# Patient Record
Sex: Female | Born: 1990 | Race: Black or African American | Hispanic: Yes | Marital: Single | Smoking: Former smoker
Health system: Southern US, Community
[De-identification: ages and names within clinical notes are randomized; demographics above are authoritative.]

## PROBLEM LIST (undated history)

## (undated) DIAGNOSIS — T7422XA Child sexual abuse, confirmed, initial encounter: Secondary | ICD-10-CM

## (undated) DIAGNOSIS — J45909 Unspecified asthma, uncomplicated: Secondary | ICD-10-CM

## (undated) DIAGNOSIS — Z8619 Personal history of other infectious and parasitic diseases: Secondary | ICD-10-CM

## (undated) DIAGNOSIS — E0590 Thyrotoxicosis, unspecified without thyrotoxic crisis or storm: Secondary | ICD-10-CM

## (undated) HISTORY — PX: DILATION AND CURETTAGE OF UTERUS: SHX78

---

## 2013-11-18 ENCOUNTER — Emergency Department: Payer: Self-pay | Admitting: Emergency Medicine

## 2013-11-18 LAB — CBC WITH DIFFERENTIAL/PLATELET
Basophil #: 0 10*3/uL (ref 0.0–0.1)
Basophil %: 0.3 %
EOS PCT: 2.2 %
Eosinophil #: 0.2 10*3/uL (ref 0.0–0.7)
HCT: 40.1 % (ref 35.0–47.0)
HGB: 13.2 g/dL (ref 12.0–16.0)
LYMPHS PCT: 16.1 %
Lymphocyte #: 1.5 10*3/uL (ref 1.0–3.6)
MCH: 29.2 pg (ref 26.0–34.0)
MCHC: 33 g/dL (ref 32.0–36.0)
MCV: 89 fL (ref 80–100)
MONOS PCT: 4.5 %
Monocyte #: 0.4 x10 3/mm (ref 0.2–0.9)
NEUTROS ABS: 6.9 10*3/uL — AB (ref 1.4–6.5)
NEUTROS PCT: 76.9 %
PLATELETS: 234 10*3/uL (ref 150–440)
RBC: 4.52 10*6/uL (ref 3.80–5.20)
RDW: 12.9 % (ref 11.5–14.5)
WBC: 9 10*3/uL (ref 3.6–11.0)

## 2013-11-18 LAB — BASIC METABOLIC PANEL
Anion Gap: 4 — ABNORMAL LOW (ref 7–16)
BUN: 9 mg/dL (ref 7–18)
Calcium, Total: 9 mg/dL (ref 8.5–10.1)
Chloride: 109 mmol/L — ABNORMAL HIGH (ref 98–107)
Co2: 26 mmol/L (ref 21–32)
Creatinine: 0.78 mg/dL (ref 0.60–1.30)
GLUCOSE: 99 mg/dL (ref 65–99)
Osmolality: 276 (ref 275–301)
Potassium: 3.6 mmol/L (ref 3.5–5.1)
Sodium: 139 mmol/L (ref 136–145)

## 2013-11-23 LAB — CULTURE, BLOOD (SINGLE)

## 2014-08-28 DIAGNOSIS — Z8619 Personal history of other infectious and parasitic diseases: Secondary | ICD-10-CM

## 2014-08-28 HISTORY — DX: Personal history of other infectious and parasitic diseases: Z86.19

## 2016-03-24 ENCOUNTER — Encounter (HOSPITAL_COMMUNITY): Payer: Self-pay | Admitting: *Deleted

## 2016-03-24 ENCOUNTER — Inpatient Hospital Stay (HOSPITAL_COMMUNITY)
Admission: AD | Admit: 2016-03-24 | Discharge: 2016-03-24 | Disposition: A | Discharge status: Home / Self Care | Payer: Medicaid Other | Source: Ambulatory Visit | Attending: Obstetrics & Gynecology | Admitting: Obstetrics & Gynecology

## 2016-03-24 DIAGNOSIS — N760 Acute vaginitis: Secondary | ICD-10-CM

## 2016-03-24 DIAGNOSIS — R109 Unspecified abdominal pain: Secondary | ICD-10-CM | POA: Diagnosis present

## 2016-03-24 DIAGNOSIS — Z3202 Encounter for pregnancy test, result negative: Secondary | ICD-10-CM

## 2016-03-24 DIAGNOSIS — Z87891 Personal history of nicotine dependence: Secondary | ICD-10-CM | POA: Diagnosis not present

## 2016-03-24 DIAGNOSIS — A499 Bacterial infection, unspecified: Secondary | ICD-10-CM | POA: Diagnosis not present

## 2016-03-24 DIAGNOSIS — E213 Hyperparathyroidism, unspecified: Secondary | ICD-10-CM | POA: Diagnosis not present

## 2016-03-24 DIAGNOSIS — B9689 Other specified bacterial agents as the cause of diseases classified elsewhere: Secondary | ICD-10-CM

## 2016-03-24 HISTORY — DX: Thyrotoxicosis, unspecified without thyrotoxic crisis or storm: E05.90

## 2016-03-24 HISTORY — DX: Personal history of other infectious and parasitic diseases: Z86.19

## 2016-03-24 HISTORY — DX: Child sexual abuse, confirmed, initial encounter: T74.22XA

## 2016-03-24 HISTORY — DX: Unspecified asthma, uncomplicated: J45.909

## 2016-03-24 LAB — URINALYSIS, ROUTINE W REFLEX MICROSCOPIC
Bilirubin Urine: NEGATIVE
GLUCOSE, UA: NEGATIVE mg/dL
Hgb urine dipstick: NEGATIVE
Ketones, ur: NEGATIVE mg/dL
Leukocytes, UA: NEGATIVE
Nitrite: NEGATIVE
Protein, ur: NEGATIVE mg/dL
Specific Gravity, Urine: 1.02 (ref 1.005–1.030)
pH: 6 (ref 5.0–8.0)

## 2016-03-24 LAB — WET PREP, GENITAL
SPERM: NONE SEEN
TRICH WET PREP: NONE SEEN
Yeast Wet Prep HPF POC: NONE SEEN

## 2016-03-24 LAB — TYPE AND SCREEN
ABO/RH(D): O POS
Antibody Screen: NEGATIVE

## 2016-03-24 LAB — CBC
HEMATOCRIT: 39.7 % (ref 36.0–46.0)
Hemoglobin: 13.4 g/dL (ref 12.0–15.0)
MCH: 28.9 pg (ref 26.0–34.0)
MCHC: 33.8 g/dL (ref 30.0–36.0)
MCV: 85.6 fL (ref 78.0–100.0)
PLATELETS: 269 10*3/uL (ref 150–400)
RBC: 4.64 MIL/uL (ref 3.87–5.11)
RDW: 13.1 % (ref 11.5–15.5)
WBC: 5.9 10*3/uL (ref 4.0–10.5)

## 2016-03-24 LAB — HCG, QUANTITATIVE, PREGNANCY: hCG, Beta Chain, Quant, S: <1 m[IU]/mL (ref <5)

## 2016-03-24 LAB — POCT PREGNANCY, URINE: PREG TEST UR: NEGATIVE

## 2016-03-24 LAB — ABO/RH: ABO/RH(D): O POS

## 2016-03-24 MED ORDER — METRONIDAZOLE 500 MG PO TABS: 500.0000 mg | ORAL_TABLET | Freq: Two times a day (BID) | ORAL | 0 refills | Status: DC

## 2016-03-24 NOTE — MAU Note (Signed)
Experiencing pain in lower abd- started on the 23rd, went to hosp on the 24th. Couldn't tell them definitively if she had been pregnant or if she had a miscarriage.Marland Kitchen Has been nauseated.  (had blood work, [place], CT scan), has been really constipated

## 2016-03-24 NOTE — Discharge Instructions (Signed)
Bacterial Vaginosis Bacterial vaginosis is a vaginal infection that occurs when the normal balance of bacteria in the vagina is disrupted. It results from an overgrowth of certain bacteria. This is the most common vaginal infection in women of childbearing age. Treatment is important to prevent complications, especially in pregnant women, as it can cause a premature delivery. CAUSES  Bacterial vaginosis is caused by an increase in harmful bacteria that are normally present in smaller amounts in the vagina. Several different kinds of bacteria can cause bacterial vaginosis. However, the reason that the condition develops is not fully understood. RISK FACTORS Certain activities or behaviors can put you at an increased risk of developing bacterial vaginosis, including:  Having a new sex partner or multiple sex partners.  Douching.  Using an intrauterine device (IUD) for contraception. Women do not get bacterial vaginosis from toilet seats, bedding, swimming pools, or contact with objects around them. SIGNS AND SYMPTOMS  Some women with bacterial vaginosis have no signs or symptoms. Common symptoms include:  Grey vaginal discharge.  A fishlike odor with discharge, especially after sexual intercourse.  Itching or burning of the vagina and vulva.  Burning or pain with urination. DIAGNOSIS  Your health care provider will take a medical history and examine the vagina for signs of bacterial vaginosis. A sample of vaginal fluid may be taken. Your health care provider will look at this sample under a microscope to check for bacteria and abnormal cells. A vaginal pH test may also be done.  TREATMENT  Bacterial vaginosis may be treated with antibiotic medicines. These may be given in the form of a pill or a vaginal cream. A second round of antibiotics may be prescribed if the condition comes back after treatment. Because bacterial vaginosis increases your risk for sexually transmitted diseases, getting  treated can help reduce your risk for chlamydia, gonorrhea, HIV, and herpes. HOME CARE INSTRUCTIONS   Only take over-the-counter or prescription medicines as directed by your health care provider. If antibiotic medicine was prescribed, take it as directed. Make sure you finish it even if you start to feel better. Abdominal Pain, Adult Many things can cause belly (abdominal) pain. Most times, the belly pain is not dangerous. Many cases of belly pain can be watched and treated at home. HOME CARE  Do not take medicines that help you go poop (laxatives) unless told to by your doctor. Only take medicine as told by your doctor. Eat or drink as told by your doctor. Your doctor will tell you if you should be on a special diet. GET HELP IF: You do not know what is causing your belly pain. You have belly pain while you are sick to your stomach (nauseous) or have runny poop (diarrhea). You have pain while you pee or poop. Your belly pain wakes you up at night. You have belly pain that gets worse or better when you eat. You have belly pain that gets worse when you eat fatty foods. You have a fever. GET HELP RIGHT AWAY IF:  The pain does not go away within 2 hours. You keep throwing up (vomiting). The pain changes and is only in the right or left part of the belly. You have bloody or tarry looking poop. MAKE SURE YOU:  Understand these instructions. Will watch your condition. Will get help right away if you are not doing well or get worse.   This information is not intended to replace advice given to you by your health care provider. Make sure you  discuss any questions you have with your health care provider.   Document Released: 01/31/2008 Document Revised: 09/04/2014 Document Reviewed: 04/23/2013 Elsevier Interactive Patient Education [employer]    During treatment, it is important that you follow these instructions:  Avoid sexual activity or use condoms correctly.  Do not  douche.  Avoid alcohol as directed by your health care provider.  Avoid breastfeeding as directed by your health care provider. SEEK MEDICAL CARE IF:   Your symptoms are not improving after 3 days of treatment.  You have increased discharge or pain.  You have a fever. MAKE SURE YOU:   Understand these instructions.  Will watch your condition.  Will get help right away if you are not doing well or get worse. FOR MORE INFORMATION  Centers for Disease Control and Prevention, Division of STD Prevention: [employer]Apps.co.za American Sexual Health Association (ASHA): www.ashastd.org    This information is not intended to replace advice given to you by your health care provider. Make sure you discuss any questions you have with your health care provider.   Document Released: 08/14/2005 Document Revised: 09/04/2014 Document Reviewed: 03/26/2013 Elsevier Interactive Patient Education [employer]

## 2016-03-24 NOTE — MAU Note (Signed)
4 HPT: first rest was neg early May; +HPT end of MAY and 2 more + in June
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
## 2016-03-25 LAB — HIV ANTIBODY (ROUTINE TESTING W REFLEX): HIV Screen 4th Generation wRfx: NONREACTIVE

## 2016-03-27 LAB — GC/CHLAMYDIA PROBE AMP (~~LOC~~) NOT AT ARMC
CHLAMYDIA, DNA PROBE: NEGATIVE
NEISSERIA GONORRHEA: NEGATIVE

## 2017-02-12 ENCOUNTER — Emergency Department (HOSPITAL_COMMUNITY): Payer: Medicaid Other

## 2017-02-12 ENCOUNTER — Encounter (HOSPITAL_COMMUNITY): Payer: Self-pay

## 2017-02-12 ENCOUNTER — Emergency Department (HOSPITAL_COMMUNITY)
Admission: EM | Admit: 2017-02-12 | Discharge: 2017-02-13 | Disposition: A | Discharge status: Home / Self Care | Payer: Medicaid Other | Attending: Emergency Medicine | Admitting: Emergency Medicine

## 2017-02-12 DIAGNOSIS — Y33XXXA Other specified events, undetermined intent, initial encounter: Secondary | ICD-10-CM | POA: Diagnosis not present

## 2017-02-12 DIAGNOSIS — E0590 Thyrotoxicosis, unspecified without thyrotoxic crisis or storm: Secondary | ICD-10-CM | POA: Insufficient documentation

## 2017-02-12 DIAGNOSIS — Z79899 Other long term (current) drug therapy: Secondary | ICD-10-CM | POA: Diagnosis not present

## 2017-02-12 DIAGNOSIS — Z87891 Personal history of nicotine dependence: Secondary | ICD-10-CM | POA: Insufficient documentation

## 2017-02-12 DIAGNOSIS — Y9389 Activity, other specified: Secondary | ICD-10-CM | POA: Insufficient documentation

## 2017-02-12 DIAGNOSIS — Y929 Unspecified place or not applicable: Secondary | ICD-10-CM | POA: Diagnosis not present

## 2017-02-12 DIAGNOSIS — Y990 Civilian activity done for income or pay: Secondary | ICD-10-CM | POA: Insufficient documentation

## 2017-02-12 DIAGNOSIS — S63502A Unspecified sprain of left wrist, initial encounter: Secondary | ICD-10-CM

## 2017-02-12 DIAGNOSIS — J45909 Unspecified asthma, uncomplicated: Secondary | ICD-10-CM | POA: Insufficient documentation

## 2017-02-12 DIAGNOSIS — S6992XA Unspecified injury of left wrist, hand and finger(s), initial encounter: Secondary | ICD-10-CM | POA: Diagnosis present

## 2017-02-12 MED ORDER — IBUPROFEN 800 MG PO TABS
800.0000 mg | ORAL_TABLET | Freq: Once | ORAL | Status: AC
  Administered 2017-02-12: 800 mg via ORAL
  Filled 2017-02-12: qty 1

## 2017-02-12 MED ORDER — ONDANSETRON 4 MG PO TBDP
ORAL_TABLET | ORAL | Status: AC
  Filled 2017-02-12: qty 1

## 2017-02-12 MED ORDER — IBUPROFEN 800 MG PO TABS: 800.0000 mg | ORAL_TABLET | Freq: Three times a day (TID) | ORAL | 0 refills | Status: DC

## 2017-02-12 NOTE — ED Notes (Signed)
Patient Alert and oriented X4. Stable and ambulatory. Patient verbalized understanding of the discharge instructions.  Patient belongings were taken by the patient.  

## 2017-02-12 NOTE — ED Notes (Signed)
Patient called out and stated they would like to see the provider.  This RN talked with patient and explained that the provider is in another room and they will be with them when they are finished.

## 2017-02-12 NOTE — Discharge Instructions (Signed)
As discussed, follow the RICE protocol provided in these instructions. Ibuprofen for pain and swelling. Follow up with orthopedics.  Return if pain worsens, swelling, loss of sensation or discoloration or any new concerning symptoms in the meantime.

## 2017-02-12 NOTE — ED Notes (Signed)
ED PA at bedside

## 2017-02-12 NOTE — ED Triage Notes (Signed)
Pt endorses left wrist pain that began when moving a patient 3 days ago. Pt able to make fist but has limited movement of left wrist. VSS.
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
## 2017-05-09 ENCOUNTER — Emergency Department (HOSPITAL_COMMUNITY)
Admission: EM | Admit: 2017-05-09 | Discharge: 2017-05-09 | Disposition: A | Discharge status: Home / Self Care | Payer: Medicaid Other | Attending: Emergency Medicine | Admitting: Emergency Medicine

## 2017-05-09 ENCOUNTER — Encounter (HOSPITAL_COMMUNITY): Payer: Self-pay

## 2017-05-09 DIAGNOSIS — B309 Viral conjunctivitis, unspecified: Secondary | ICD-10-CM

## 2017-05-09 DIAGNOSIS — E039 Hypothyroidism, unspecified: Secondary | ICD-10-CM | POA: Insufficient documentation

## 2017-05-09 DIAGNOSIS — J069 Acute upper respiratory infection, unspecified: Secondary | ICD-10-CM | POA: Diagnosis not present

## 2017-05-09 DIAGNOSIS — J45909 Unspecified asthma, uncomplicated: Secondary | ICD-10-CM | POA: Insufficient documentation

## 2017-05-09 DIAGNOSIS — R600 Localized edema: Secondary | ICD-10-CM | POA: Diagnosis present

## 2017-05-09 DIAGNOSIS — Z79899 Other long term (current) drug therapy: Secondary | ICD-10-CM | POA: Insufficient documentation

## 2017-05-09 DIAGNOSIS — Z87891 Personal history of nicotine dependence: Secondary | ICD-10-CM | POA: Diagnosis not present

## 2017-05-09 MED ORDER — ALBUTEROL SULFATE HFA 108 (90 BASE) MCG/ACT IN AERS
2.0000 | INHALATION_SPRAY | Freq: Once | RESPIRATORY_TRACT | Status: AC
  Administered 2017-05-09: 2 via RESPIRATORY_TRACT
  Filled 2017-05-09: qty 6.7

## 2017-05-09 MED ORDER — CIPROFLOXACIN HCL 0.3 % OP SOLN
1.0000 [drp] | Freq: Once | OPHTHALMIC | Status: AC
  Administered 2017-05-09: 1 [drp] via OPHTHALMIC
  Filled 2017-05-09: qty 2.5

## 2017-05-09 MED ORDER — METHYLPREDNISOLONE 4 MG PO TBPK: ORAL_TABLET | ORAL | 0 refills | Status: DC

## 2017-05-09 MED ORDER — LEVOCETIRIZINE DIHYDROCHLORIDE 5 MG PO TABS: 5.0000 mg | ORAL_TABLET | Freq: Every evening | ORAL | 1 refills | Status: DC

## 2017-05-09 MED ORDER — PSEUDOEPHEDRINE HCL ER 120 MG PO TB12: 120.0000 mg | ORAL_TABLET | Freq: Two times a day (BID) | ORAL | 0 refills | Status: DC

## 2017-05-09 MED ORDER — KETOTIFEN FUMARATE 0.025 % OP SOLN: 1.0000 [drp] | Freq: Two times a day (BID) | OPHTHALMIC | 0 refills | Status: DC

## 2017-05-09 NOTE — ED Triage Notes (Signed)
Per Pt, Pt reports itchy throat and watery eyes that started two days ago. Pt reports discharge from her eyes in the morning with some pressure. Reports some throat discomfort.

## 2017-05-09 NOTE — Discharge Instructions (Signed)
You appear to have an upper respiratory infection (URI). An upper respiratory tract infection, or cold, is a viral infection of the air passages leading to the lungs. It is contagious and can be spread to others, especially during the first 3 or 4 days. It cannot be cured by antibiotics or other medicines. RETURN IMMEDIATELY IF you develop shortness of breath, confusion or altered mental status, a new rash, become dizzy, faint, or poorly responsive, or are unable to be cared for at home.  Contact a health care provider if: Your symptoms do not improve with treatment or they get worse. You have increased pain. Your vision becomes blurry. You have a fever. You have facial pain, redness, or swelling. You have yellow or green drainage coming from your eye. You have new symptoms.
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
## 2017-11-16 ENCOUNTER — Encounter (HOSPITAL_COMMUNITY): Payer: Self-pay

## 2017-11-16 ENCOUNTER — Emergency Department (HOSPITAL_COMMUNITY)
Admission: EM | Admit: 2017-11-16 | Discharge: 2017-11-17 | Disposition: A | Discharge status: Home / Self Care | Payer: Medicaid Other | Attending: Emergency Medicine | Admitting: Emergency Medicine

## 2017-11-16 DIAGNOSIS — Z87891 Personal history of nicotine dependence: Secondary | ICD-10-CM | POA: Insufficient documentation

## 2017-11-16 DIAGNOSIS — E039 Hypothyroidism, unspecified: Secondary | ICD-10-CM | POA: Diagnosis not present

## 2017-11-16 DIAGNOSIS — K529 Noninfective gastroenteritis and colitis, unspecified: Secondary | ICD-10-CM | POA: Diagnosis not present

## 2017-11-16 DIAGNOSIS — Z79899 Other long term (current) drug therapy: Secondary | ICD-10-CM | POA: Diagnosis not present

## 2017-11-16 DIAGNOSIS — R112 Nausea with vomiting, unspecified: Secondary | ICD-10-CM | POA: Diagnosis present

## 2017-11-16 DIAGNOSIS — J45909 Unspecified asthma, uncomplicated: Secondary | ICD-10-CM | POA: Diagnosis not present

## 2017-11-16 LAB — CBC
HCT: 44.9 % (ref 36.0–46.0)
Hemoglobin: 14.9 g/dL (ref 12.0–15.0)
MCH: 29.9 pg (ref 26.0–34.0)
MCHC: 33.2 g/dL (ref 30.0–36.0)
MCV: 90.2 fL (ref 78.0–100.0)
PLATELETS: 299 10*3/uL (ref 150–400)
RBC: 4.98 MIL/uL (ref 3.87–5.11)
RDW: 13.6 % (ref 11.5–15.5)
WBC: 7.8 10*3/uL (ref 4.0–10.5)

## 2017-11-16 LAB — I-STAT BETA HCG BLOOD, ED (MC, WL, AP ONLY)

## 2017-11-16 LAB — COMPREHENSIVE METABOLIC PANEL
ALT: 19 U/L (ref 14–54)
ANION GAP: 8 (ref 5–15)
AST: 24 U/L (ref 15–41)
Albumin: 4 g/dL (ref 3.5–5.0)
Alkaline Phosphatase: 52 U/L (ref 38–126)
BUN: 7 mg/dL (ref 6–20)
CO2: 24 mmol/L (ref 22–32)
Calcium: 9.1 mg/dL (ref 8.9–10.3)
Chloride: 104 mmol/L (ref 101–111)
Creatinine, Ser: 0.71 mg/dL (ref 0.44–1.00)
GFR calc Af Amer: >60 mL/min (ref >60)
Glucose, Bld: 134 mg/dL — ABNORMAL HIGH (ref 65–99)
POTASSIUM: 4 mmol/L (ref 3.5–5.1)
Sodium: 136 mmol/L (ref 135–145)
TOTAL PROTEIN: 7.1 g/dL (ref 6.5–8.1)
Total Bilirubin: 0.4 mg/dL (ref 0.3–1.2)

## 2017-11-16 LAB — LIPASE, BLOOD: LIPASE: 26 U/L (ref 11–51)

## 2017-11-16 MED ORDER — PROMETHAZINE HCL 25 MG/ML IJ SOLN
25.0000 mg | Freq: Once | INTRAMUSCULAR | Status: AC
  Administered 2017-11-16: 25 mg via INTRAVENOUS
  Filled 2017-11-16: qty 1

## 2017-11-16 MED ORDER — ONDANSETRON 4 MG PO TBDP
4.0000 mg | ORAL_TABLET | Freq: Once | ORAL | Status: AC | PRN
  Administered 2017-11-16: 4 mg via ORAL
  Filled 2017-11-16: qty 1

## 2017-11-16 MED ORDER — ONDANSETRON 4 MG PO TBDP
4.0000 mg | ORAL_TABLET | Freq: Once | ORAL | Status: AC
  Administered 2017-11-16: 4 mg via ORAL
  Filled 2017-11-16: qty 1

## 2017-11-16 MED ORDER — SODIUM CHLORIDE 0.9 % IV BOLUS (SEPSIS)
2000.0000 mL | Freq: Once | INTRAVENOUS | Status: AC
Start: 2017-11-16 — End: 2017-11-16
  Administered 2017-11-16: 2000 mL via INTRAVENOUS

## 2017-11-16 NOTE — ED Notes (Signed)
Pt drinking fluids at this time

## 2017-11-16 NOTE — ED Triage Notes (Signed)
Pt presents for evaluation of abd pain with N/V/D starting this AM. Pt reports headache and dizziness since started vomiting. States emesis approximately 20 times, unable to tolerate anything PO.
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
## 2017-11-17 MED ORDER — PROMETHAZINE HCL 25 MG PO TABS: 25.0000 mg | ORAL_TABLET | Freq: Four times a day (QID) | ORAL | 0 refills | Status: DC | PRN

## 2017-11-17 NOTE — Discharge Instructions (Addendum)
Slowly increase your fluid intake.  Rest as much as possible.  Following the brat diet provided.

## 2019-11-21 ENCOUNTER — Other Ambulatory Visit: Payer: Self-pay | Admitting: Urgent Care

## 2019-11-24 ENCOUNTER — Other Ambulatory Visit: Payer: Self-pay | Admitting: Urgent Care

## 2019-11-24 DIAGNOSIS — N643 Galactorrhea not associated with childbirth: Secondary | ICD-10-CM

## 2020-06-30 ENCOUNTER — Other Ambulatory Visit: Payer: Self-pay

## 2020-06-30 DIAGNOSIS — Z20822 Contact with and (suspected) exposure to covid-19: Secondary | ICD-10-CM

## 2020-07-02 LAB — SARS-COV-2, NAA 2 DAY TAT

## 2020-07-02 LAB — NOVEL CORONAVIRUS, NAA: SARS-CoV-2, NAA: NOT DETECTED

## 2020-08-18 ENCOUNTER — Other Ambulatory Visit: Payer: Self-pay | Admitting: Internal Medicine

## 2020-08-18 ENCOUNTER — Other Ambulatory Visit: Payer: Self-pay | Admitting: Family Medicine

## 2020-08-18 ENCOUNTER — Other Ambulatory Visit (HOSPITAL_COMMUNITY): Payer: Self-pay | Admitting: Family Medicine

## 2020-08-18 DIAGNOSIS — O209 Hemorrhage in early pregnancy, unspecified: Secondary | ICD-10-CM

## 2020-08-19 ENCOUNTER — Ambulatory Visit
Admission: RE | Admit: 2020-08-19 | Discharge: 2020-08-19 | Disposition: A | Discharge status: Home / Self Care | Payer: Medicaid Other | Source: Ambulatory Visit | Attending: Family Medicine | Admitting: Family Medicine

## 2020-08-19 ENCOUNTER — Other Ambulatory Visit: Payer: Self-pay

## 2020-08-19 DIAGNOSIS — O209 Hemorrhage in early pregnancy, unspecified: Secondary | ICD-10-CM | POA: Insufficient documentation

## 2020-09-07 LAB — OB RESULTS CONSOLE VARICELLA ZOSTER ANTIBODY, IGG: Varicella: IMMUNE

## 2020-09-07 LAB — OB RESULTS CONSOLE HEPATITIS B SURFACE ANTIGEN: Hepatitis B Surface Ag: NEGATIVE

## 2020-09-07 LAB — OB RESULTS CONSOLE RPR: RPR: NONREACTIVE

## 2020-09-07 LAB — OB RESULTS CONSOLE RUBELLA ANTIBODY, IGM: Rubella: IMMUNE

## 2020-09-07 LAB — OB RESULTS CONSOLE HIV ANTIBODY (ROUTINE TESTING): HIV: NONREACTIVE

## 2020-09-08 ENCOUNTER — Other Ambulatory Visit: Payer: Self-pay

## 2020-09-08 DIAGNOSIS — Z20822 Contact with and (suspected) exposure to covid-19: Secondary | ICD-10-CM

## 2020-09-08 LAB — OB RESULTS CONSOLE GC/CHLAMYDIA
Chlamydia: NEGATIVE
Gonorrhea: NEGATIVE

## 2020-09-10 LAB — NOVEL CORONAVIRUS, NAA: SARS-CoV-2, NAA: NOT DETECTED

## 2020-09-10 LAB — SARS-COV-2, NAA 2 DAY TAT

## 2020-09-14 ENCOUNTER — Other Ambulatory Visit: Payer: Self-pay

## 2020-09-15 ENCOUNTER — Other Ambulatory Visit: Payer: Self-pay

## 2020-11-09 ENCOUNTER — Other Ambulatory Visit: Payer: Self-pay | Admitting: Cardiology

## 2020-11-09 ENCOUNTER — Other Ambulatory Visit: Payer: Self-pay | Admitting: Family Medicine

## 2020-11-09 DIAGNOSIS — R0789 Other chest pain: Secondary | ICD-10-CM

## 2020-11-09 DIAGNOSIS — R55 Syncope and collapse: Secondary | ICD-10-CM

## 2020-11-09 DIAGNOSIS — Z3689 Encounter for other specified antenatal screening: Secondary | ICD-10-CM

## 2020-11-16 ENCOUNTER — Other Ambulatory Visit: Payer: Self-pay

## 2020-11-16 ENCOUNTER — Observation Stay
Admission: EM | Admit: 2020-11-16 | Discharge: 2020-11-17 | Disposition: A | Discharge status: Home / Self Care | Payer: BLUE CROSS/BLUE SHIELD | Attending: Obstetrics and Gynecology | Admitting: Obstetrics and Gynecology

## 2020-11-16 ENCOUNTER — Observation Stay: Payer: BLUE CROSS/BLUE SHIELD

## 2020-11-16 DIAGNOSIS — O99512 Diseases of the respiratory system complicating pregnancy, second trimester: Secondary | ICD-10-CM | POA: Insufficient documentation

## 2020-11-16 DIAGNOSIS — Z3A21 21 weeks gestation of pregnancy: Secondary | ICD-10-CM | POA: Insufficient documentation

## 2020-11-16 DIAGNOSIS — Z3490 Encounter for supervision of normal pregnancy, unspecified, unspecified trimester: Secondary | ICD-10-CM

## 2020-11-16 DIAGNOSIS — Z20822 Contact with and (suspected) exposure to covid-19: Secondary | ICD-10-CM | POA: Diagnosis not present

## 2020-11-16 DIAGNOSIS — J45909 Unspecified asthma, uncomplicated: Secondary | ICD-10-CM | POA: Diagnosis not present

## 2020-11-16 DIAGNOSIS — R102 Pelvic and perineal pain: Secondary | ICD-10-CM | POA: Diagnosis not present

## 2020-11-16 DIAGNOSIS — O26899 Other specified pregnancy related conditions, unspecified trimester: Secondary | ICD-10-CM

## 2020-11-16 DIAGNOSIS — Z87891 Personal history of nicotine dependence: Secondary | ICD-10-CM | POA: Diagnosis not present

## 2020-11-16 DIAGNOSIS — O26892 Other specified pregnancy related conditions, second trimester: Principal | ICD-10-CM | POA: Insufficient documentation

## 2020-11-16 LAB — URINALYSIS, ROUTINE W REFLEX MICROSCOPIC
Bilirubin Urine: NEGATIVE
Glucose, UA: 150 mg/dL — AB
Hgb urine dipstick: NEGATIVE
Ketones, ur: NEGATIVE mg/dL
Leukocytes,Ua: NEGATIVE
Nitrite: NEGATIVE
Protein, ur: NEGATIVE mg/dL
Specific Gravity, Urine: 1.01 (ref 1.005–1.030)
pH: 6 (ref 5.0–8.0)

## 2020-11-16 LAB — CBC
HCT: 34.6 % — ABNORMAL LOW (ref 36.0–46.0)
Hemoglobin: 11.6 g/dL — ABNORMAL LOW (ref 12.0–15.0)
MCH: 29.2 pg (ref 26.0–34.0)
MCHC: 33.5 g/dL (ref 30.0–36.0)
MCV: 87.2 fL (ref 80.0–100.0)
Platelets: 237 10*3/uL (ref 150–400)
RBC: 3.97 MIL/uL (ref 3.87–5.11)
RDW: 13.4 % (ref 11.5–15.5)
WBC: 8.4 10*3/uL (ref 4.0–10.5)
nRBC: 0 % (ref 0.0–0.2)

## 2020-11-16 LAB — TYPE AND SCREEN
ABO/RH(D): O POS
Antibody Screen: NEGATIVE

## 2020-11-16 MED ORDER — OXYCODONE-ACETAMINOPHEN 5-325 MG PO TABS
1.0000 | ORAL_TABLET | ORAL | Status: DC | PRN
  Administered 2020-11-16 – 2020-11-17 (×2): 1 via ORAL
  Filled 2020-11-16 (×2): qty 1

## 2020-11-16 MED ORDER — ONDANSETRON HCL 4 MG/2ML IJ SOLN
4.0000 mg | Freq: Four times a day (QID) | INTRAMUSCULAR | Status: DC | PRN
  Administered 2020-11-17: 4 mg via INTRAVENOUS
  Filled 2020-11-16: qty 2

## 2020-11-16 MED ORDER — LACTATED RINGERS IV SOLN: INTRAVENOUS | Status: DC

## 2020-11-16 MED ORDER — OXYCODONE-ACETAMINOPHEN 5-325 MG PO TABS
ORAL_TABLET | ORAL | Status: AC
  Filled 2020-11-16: qty 1

## 2020-11-16 MED ORDER — LIDOCAINE HCL (PF) 1 % IJ SOLN: 30.0000 mL | INTRAMUSCULAR | Status: DC | PRN

## 2020-11-16 MED ORDER — SOD CITRATE-CITRIC ACID 500-334 MG/5ML PO SOLN: 30.0000 mL | ORAL | Status: DC | PRN

## 2020-11-16 MED ORDER — ACETAMINOPHEN 325 MG PO TABS
650.0000 mg | ORAL_TABLET | ORAL | Status: DC | PRN
  Administered 2020-11-17: 650 mg via ORAL
  Filled 2020-11-16: qty 2
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
## 2020-11-16 NOTE — OB Triage Note (Signed)
Patient G4P1 [redacted]w[redacted]d presents to L&D with complaints of abdominal pain since yesterday at 1500. She reports pain getting worse in intensity, but same frequency. She reports having some spotting last night at 3 AM, but reports not having any since, She reports feeling movement, but being unaware since the pain started. She denies vaginal discharge and recent intercourse. She reports complications of dizziness in her pregnancy where she feels her chest tighten/gets weak/and loses mobility. She reports being followed by cardiologist for this in the pregnancy. Monitors applies and assessing. VSS.

## 2020-11-17 ENCOUNTER — Observation Stay: Payer: BLUE CROSS/BLUE SHIELD

## 2020-11-17 DIAGNOSIS — O26892 Other specified pregnancy related conditions, second trimester: Secondary | ICD-10-CM | POA: Diagnosis not present

## 2020-11-17 LAB — RPR: RPR Ser Ql: NONREACTIVE

## 2020-11-17 LAB — RESP PANEL BY RT-PCR (FLU A&B, COVID) ARPGX2
Influenza A by PCR: NEGATIVE
Influenza B by PCR: NEGATIVE
SARS Coronavirus 2 by RT PCR: NEGATIVE

## 2020-11-17 LAB — ABO/RH: ABO/RH(D): O POS

## 2020-11-17 MED ORDER — PROGESTERONE MICRONIZED 100 MG PO CAPS: 100.0000 mg | ORAL_CAPSULE | Freq: Every day | ORAL | 11 refills | Status: DC

## 2020-11-17 NOTE — Progress Notes (Signed)
Patient back to unit from ultrasound.

## 2020-11-17 NOTE — Discharge Summary (Signed)
Patient ID: Suzanne Wilson MRN: [id number] DOB/AGE: [birth year] 29 y.o.  Admit date: 11/16/2020 Discharge date: 11/17/2020  Admission Diagnoses: Lower pelvic pressure with a h/o early cervical dilation   Discharge Diagnoses: no cervical shortening   Prenatal Procedures: ultrasound  Consults: None   Significant Diagnostic Studies:  Results for orders placed or performed during the hospital encounter of 11/16/20 (from the past 168 hour(s))  CBC   Collection Time: 11/16/20  8:57 PM  Result Value Ref Range   WBC 8.4 4.0 - 10.5 K/uL   RBC 3.97 3.87 - 5.11 MIL/uL   Hemoglobin 11.6 (L) 12.0 - 15.0 g/dL   HCT 50.3 (L) 54.6 - 56.8 %   MCV 87.2 80.0 - 100.0 fL   MCH 29.2 26.0 - 34.0 pg   MCHC 33.5 30.0 - 36.0 g/dL   RDW 12.7 51.7 - 00.1 %   Platelets 237 150 - 400 K/uL   nRBC 0.0 0.0 - 0.2 %  RPR   Collection Time: 11/16/20  8:57 PM  Result Value Ref Range   RPR Ser Ql NON REACTIVE NON REACTIVE  Urinalysis, Routine w reflex microscopic Urine, Clean Catch   Collection Time: 11/16/20  8:57 PM  Result Value Ref Range   Color, Urine YELLOW (A) YELLOW   APPearance HAZY (A) CLEAR   Specific Gravity, Urine 1.010 1.005 - 1.030   pH 6.0 5.0 - 8.0   Glucose, UA 150 (A) NEGATIVE mg/dL   Hgb urine dipstick NEGATIVE NEGATIVE   Bilirubin Urine NEGATIVE NEGATIVE   Ketones, ur NEGATIVE NEGATIVE mg/dL   Protein, ur NEGATIVE NEGATIVE mg/dL   Nitrite NEGATIVE NEGATIVE   Leukocytes,Ua NEGATIVE NEGATIVE  Type and screen [facility]   Collection Time: 11/16/20  8:57 PM  Result Value Ref Range   ABO/RH(D) O POS    Antibody Screen NEG    Sample Expiration      11/19/2020,2359 Performed at [facility], [address] [place], [place] [address]   ABO/Rh   Collection Time: 11/16/20 10:35 PM  Result Value Ref Range   ABO/RH(D) O POS    DAT, complement NOT NEEDED   Resp Panel by RT-PCR (Flu A&B, Covid) Nasopharyngeal Swab   Collection Time: 11/17/20  6:14 AM    Specimen: Nasopharyngeal Swab; Nasopharyngeal(NP) swabs in vial transport medium  Result Value Ref Range   SARS Coronavirus 2 by RT PCR NEGATIVE NEGATIVE   Influenza A by PCR NEGATIVE NEGATIVE   Influenza B by PCR NEGATIVE NEGATIVE    Treatments: IV hydration and analgesia: acetaminophen and acetaminophen w/ codeine  Hospital Course:  This is a 30 y.o. [id number] with IUP at [redacted]w[redacted]d seen for lower pelvic pressure with a h/o early cervical dilation.    Imaging 11/16/20  EXAM: LIMITED OBSTETRIC ULTRASOUND COMPARISON:  None. FINDINGS: Number of Fetuses: 1 Heart Rate:  137 bpm Movement: Yes Presentation: Cephalic Placental Location: Fundal Previa: No Amniotic Fluid (Subjective):  Within normal limits. AFI: 5.1 cm BPD: 5.2 cm 21 w  5 d MATERNAL FINDINGS: Cervix:  Appears closed., 3.9 cm in length Uterus/Adnexae: No abnormality visualized. IMPRESSION: Single live intrauterine pregnancy measuring 21 weeks 5 days  11/17/20 CLINICAL DATA:  Poor prenatal care.  Possible incompetent cervix. EXAM: OBSTETRICAL ULTRASOUND >14 WKS FINDINGS: Number of Fetuses: 1 Heart Rate:  147 bpm Movement: Yes Presentation: Transverse Previa: No Placental Location: Right fundal Amniotic Fluid (Subjective): Normal Amniotic Fluid (Objective): Vertical pocket = 6.3cm FETAL BIOMETRY BPD: 5.15cm 21w 4d HC: 19.04cm  21w  2d AC: 17.03cm  22w   0d FL: 3.47cm  21w   0d Current Mean GA: 21w 2d                      [place] EDC: 03/28/2021 Assigned GA:  21w 2d             Assigned EDC: 03/28/2021 FETAL ANATOMY Lateral Ventricles: Appears normal Thalami/CSP: Appears normal Posterior Fossa:  Appears normal Nuchal Region: Not visualized Upper Lip:  Appears normal Spine: Suboptimally visualized. Visualized portions demonstrate no focal abnormality. 4 Chamber Heart on Left: Appears normal LVOT: Appears normal RVOT: Appears normal Stomach on Left: Appears normal 3 Vessel Cord: Appears normal Cord Insertion site:  Appears normal Kidneys: Appears normal Bladder: Appears normal Extremities: Appears normal Sex: Female Technically difficult due to: Fetal position Maternal Findings: Cervix:  Cervical length 6.1 cm. IMPRESSION: 1. Single live intrauterine pregnancy as detailed above. 2. No focal fetal anatomic abnormality. Spine and nuchal region are suboptimally visualized. 3. Cervical length 6.1 cm.  Cervix is closed  Medications and Fluids Tylenol 650mg  11/17/20 0149 IVF LR  Zofran 4mg  IV 11/17/20 0804 Percocet 5-325mg  11/16/20 2045; and 11/17/20 0620  Labs CBC WNL T&S O pos, antibody neg RPR pending UA WNL Urine culture pending Flu, Covid Neg  Fetal Assessment Doppler 140 TOCO: quiet SVE:  Dilation: Closed Effacement (%): 50 Station: Ballotable Exam by:: Schermerhorn MD  She was observed, fetal heart rate monitoring remained reassuring, and she had no signs/symptoms of progressing preterm labor or other maternal-fetal concerns. She was deemed stable for discharge to home with outpatient follow up.  Discharge Physical Exam:  BP (!) 100/57 (BP Location: Left Arm)   Pulse 75   Temp 98.5 F (36.9 C) (Oral)   Resp 16   Ht 5\' 2"  (1.575 m)   Wt 66.2 kg   BMI 26.70 kg/m   General: NAD CV: RRR Pulm: CTABL, nl effort ABD: s/nd/nt, gravid DVT Evaluation: LE non-ttp, no evidence of DVT on exam.    Discharge Condition: Stable  Disposition: Discharge disposition: 01-Home or Self Care        Allergies as of 11/17/2020      Reactions   Red Dye Hives      Medication List    STOP taking these medications   ibuprofen 800 MG tablet Commonly known as: ADVIL   ketotifen 0.025 % ophthalmic solution Commonly known as: ZADITOR   levocetirizine 5 MG tablet Commonly known as: Xyzal   methylPREDNISolone 4 MG Tbpk tablet Commonly known as: MEDROL DOSEPAK   metroNIDAZOLE 500 MG tablet Commonly known as: FLAGYL   multivitamin with minerals Tabs tablet   naproxen sodium 220  MG tablet Commonly known as: ALEVE   promethazine 25 MG tablet Commonly known as: PHENERGAN   pseudoephedrine 120 MG 12 hr tablet Commonly known as: Sudafed 12 Hour     TAKE these medications   multivitamin-prenatal 27-0.8 MG Tabs tablet Take 1 tablet by mouth daily at 12 noon.   progesterone 100 MG capsule Commonly known as: PROMETRIUM Place 1 capsule (100 mg total) vaginally at bedtime.        [id number], CNM 11/17/2020 7:04 PM

## 2020-11-17 NOTE — Progress Notes (Signed)
Patient taken to ultrasound.

## 2020-11-18 LAB — URINE CULTURE: Culture: >=100000 — AB
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
## 2020-11-19 ENCOUNTER — Ambulatory Visit: Admission: RE | Admit: 2020-11-19 | Payer: Medicaid Other | Source: Ambulatory Visit

## 2020-12-01 ENCOUNTER — Other Ambulatory Visit: Admission: RE | Admit: 2020-12-01 | Payer: Medicaid Other | Source: Ambulatory Visit

## 2020-12-03 ENCOUNTER — Ambulatory Visit: Admission: RE | Admit: 2020-12-03 | Payer: Medicaid Other | Source: Ambulatory Visit

## 2020-12-14 ENCOUNTER — Ambulatory Visit: Payer: BLUE CROSS/BLUE SHIELD

## 2021-03-03 LAB — OB RESULTS CONSOLE GBS: GBS: NEGATIVE

## 2021-03-08 ENCOUNTER — Observation Stay
Admission: EM | Admit: 2021-03-08 | Discharge: 2021-03-08 | Disposition: A | Discharge status: Home / Self Care | Payer: BC Managed Care – PPO | Attending: Obstetrics and Gynecology | Admitting: Obstetrics and Gynecology

## 2021-03-08 ENCOUNTER — Other Ambulatory Visit: Payer: Self-pay

## 2021-03-08 ENCOUNTER — Encounter: Payer: Self-pay | Admitting: Obstetrics and Gynecology

## 2021-03-08 DIAGNOSIS — O471 False labor at or after 37 completed weeks of gestation: Principal | ICD-10-CM | POA: Insufficient documentation

## 2021-03-08 DIAGNOSIS — O479 False labor, unspecified: Secondary | ICD-10-CM | POA: Diagnosis present

## 2021-03-08 DIAGNOSIS — O26893 Other specified pregnancy related conditions, third trimester: Secondary | ICD-10-CM | POA: Insufficient documentation

## 2021-03-08 DIAGNOSIS — Z3A37 37 weeks gestation of pregnancy: Secondary | ICD-10-CM | POA: Insufficient documentation

## 2021-03-08 DIAGNOSIS — J45909 Unspecified asthma, uncomplicated: Secondary | ICD-10-CM | POA: Insufficient documentation

## 2021-03-08 DIAGNOSIS — Z87891 Personal history of nicotine dependence: Secondary | ICD-10-CM | POA: Diagnosis not present

## 2021-03-08 DIAGNOSIS — O99513 Diseases of the respiratory system complicating pregnancy, third trimester: Secondary | ICD-10-CM | POA: Insufficient documentation

## 2021-03-08 MED ORDER — OXYCODONE-ACETAMINOPHEN 5-325 MG PO TABS
1.0000 | ORAL_TABLET | Freq: Once | ORAL | Status: AC
Start: 2021-03-08 — End: 2021-03-08
  Administered 2021-03-08: 1 via ORAL
  Filled 2021-03-08: qty 1

## 2021-03-08 NOTE — OB Triage Note (Signed)
Patient is [redacted]w[redacted]d, G4P1 that presents with complaints of ctx. Pt states pain is 8/10. States that ctx started last night and have progressed throughout the day. Patient states that she has had some spotting when she wipes. No leaking of fluid. +FM. VSS. Monitors applied and assessing.
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
## 2021-03-24 ENCOUNTER — Inpatient Hospital Stay
Admission: EM | Admit: 2021-03-24 | Discharge: 2021-03-27 | DRG: 787 | Disposition: A | Discharge status: Home / Self Care | Payer: BC Managed Care – PPO | Attending: Obstetrics and Gynecology | Admitting: Obstetrics and Gynecology

## 2021-03-24 ENCOUNTER — Other Ambulatory Visit: Payer: Self-pay

## 2021-03-24 ENCOUNTER — Encounter: Payer: Self-pay | Admitting: Obstetrics and Gynecology

## 2021-03-24 ENCOUNTER — Inpatient Hospital Stay: Payer: BC Managed Care – PPO | Admitting: Anesthesiology

## 2021-03-24 ENCOUNTER — Encounter
Admission: EM | Disposition: A | Discharge status: Home / Self Care | Payer: Self-pay | Source: Home / Self Care | Attending: Obstetrics and Gynecology

## 2021-03-24 DIAGNOSIS — Z20822 Contact with and (suspected) exposure to covid-19: Secondary | ICD-10-CM | POA: Diagnosis present

## 2021-03-24 DIAGNOSIS — O26893 Other specified pregnancy related conditions, third trimester: Secondary | ICD-10-CM | POA: Diagnosis present

## 2021-03-24 DIAGNOSIS — D62 Acute posthemorrhagic anemia: Secondary | ICD-10-CM | POA: Diagnosis not present

## 2021-03-24 DIAGNOSIS — Z3A39 39 weeks gestation of pregnancy: Secondary | ICD-10-CM

## 2021-03-24 DIAGNOSIS — Z87891 Personal history of nicotine dependence: Secondary | ICD-10-CM

## 2021-03-24 DIAGNOSIS — O321XX0 Maternal care for breech presentation, not applicable or unspecified: Secondary | ICD-10-CM | POA: Diagnosis present

## 2021-03-24 DIAGNOSIS — F410 Panic disorder [episodic paroxysmal anxiety] without agoraphobia: Secondary | ICD-10-CM | POA: Diagnosis not present

## 2021-03-24 DIAGNOSIS — O99344 Other mental disorders complicating childbirth: Secondary | ICD-10-CM | POA: Diagnosis present

## 2021-03-24 DIAGNOSIS — O99284 Endocrine, nutritional and metabolic diseases complicating childbirth: Secondary | ICD-10-CM | POA: Diagnosis present

## 2021-03-24 DIAGNOSIS — E0590 Thyrotoxicosis, unspecified without thyrotoxic crisis or storm: Secondary | ICD-10-CM | POA: Diagnosis present

## 2021-03-24 DIAGNOSIS — F411 Generalized anxiety disorder: Secondary | ICD-10-CM | POA: Diagnosis present

## 2021-03-24 DIAGNOSIS — Z98891 History of uterine scar from previous surgery: Secondary | ICD-10-CM

## 2021-03-24 DIAGNOSIS — O9081 Anemia of the puerperium: Secondary | ICD-10-CM | POA: Diagnosis not present

## 2021-03-24 LAB — CBC
HCT: 36.6 % (ref 36.0–46.0)
Hemoglobin: 12.3 g/dL (ref 12.0–15.0)
MCH: 28.1 pg (ref 26.0–34.0)
MCHC: 33.6 g/dL (ref 30.0–36.0)
MCV: 83.6 fL (ref 80.0–100.0)
Platelets: 230 10*3/uL (ref 150–400)
RBC: 4.38 MIL/uL (ref 3.87–5.11)
RDW: 14 % (ref 11.5–15.5)
WBC: 5.1 10*3/uL (ref 4.0–10.5)
nRBC: 0 % (ref 0.0–0.2)

## 2021-03-24 LAB — TYPE AND SCREEN
ABO/RH(D): O POS
Antibody Screen: NEGATIVE

## 2021-03-24 LAB — RESP PANEL BY RT-PCR (FLU A&B, COVID) ARPGX2
Influenza A by PCR: NEGATIVE
Influenza B by PCR: NEGATIVE
SARS Coronavirus 2 by RT PCR: NEGATIVE

## 2021-03-24 SURGERY — Surgical Case
Anesthesia: Epidural

## 2021-03-24 MED ORDER — LIDOCAINE HCL (PF) 1 % IJ SOLN: 30.0000 mL | INTRAMUSCULAR | Status: DC | PRN

## 2021-03-24 MED ORDER — LACTATED RINGERS IV SOLN: 500.0000 mL | Freq: Once | INTRAVENOUS | Status: DC

## 2021-03-24 MED ORDER — OXYCODONE HCL 5 MG PO TABS: 5.0000 mg | ORAL_TABLET | Freq: Once | ORAL | Status: DC | PRN

## 2021-03-24 MED ORDER — LIDOCAINE HCL (PF) 2 % IJ SOLN
INTRAMUSCULAR | Status: AC
  Filled 2021-03-24: qty 10

## 2021-03-24 MED ORDER — SODIUM CHLORIDE (PF) 0.9 % IJ SOLN
INTRAMUSCULAR | Status: AC
  Filled 2021-03-24: qty 50

## 2021-03-24 MED ORDER — DEXAMETHASONE SODIUM PHOSPHATE 10 MG/ML IJ SOLN
INTRAMUSCULAR | Status: AC
  Filled 2021-03-24: qty 1

## 2021-03-24 MED ORDER — MEPERIDINE HCL 25 MG/ML IJ SOLN: 6.2500 mg | INTRAMUSCULAR | Status: DC | PRN

## 2021-03-24 MED ORDER — ALBUTEROL SULFATE HFA 108 (90 BASE) MCG/ACT IN AERS: 1.0000 | INHALATION_SPRAY | Freq: Four times a day (QID) | RESPIRATORY_TRACT | Status: DC

## 2021-03-24 MED ORDER — METHYLERGONOVINE MALEATE 0.2 MG/ML IJ SOLN
INTRAMUSCULAR | Status: AC
  Filled 2021-03-24: qty 1

## 2021-03-24 MED ORDER — ONDANSETRON HCL 4 MG/2ML IJ SOLN
4.0000 mg | Freq: Four times a day (QID) | INTRAMUSCULAR | Status: DC | PRN
  Administered 2021-03-24: 4 mg via INTRAVENOUS
  Filled 2021-03-24: qty 2

## 2021-03-24 MED ORDER — KETOROLAC TROMETHAMINE 30 MG/ML IJ SOLN
30.0000 mg | Freq: Four times a day (QID) | INTRAMUSCULAR | Status: DC
Start: 2021-03-24 — End: 2021-03-25
  Administered 2021-03-24 – 2021-03-25 (×2): 30 mg via INTRAVENOUS
  Filled 2021-03-24: qty 1

## 2021-03-24 MED ORDER — CEFAZOLIN SODIUM-DEXTROSE 2-4 GM/100ML-% IV SOLN
INTRAVENOUS | Status: AC
  Filled 2021-03-24: qty 100

## 2021-03-24 MED ORDER — ALBUTEROL SULFATE (2.5 MG/3ML) 0.083% IN NEBU
2.5000 mg | INHALATION_SOLUTION | Freq: Four times a day (QID) | RESPIRATORY_TRACT | Status: DC
  Administered 2021-03-25: 2.5 mg via RESPIRATORY_TRACT
  Filled 2021-03-24 (×2): qty 3

## 2021-03-24 MED ORDER — SODIUM CHLORIDE 0.9% FLUSH: 3.0000 mL | INTRAVENOUS | Status: DC | PRN

## 2021-03-24 MED ORDER — ONDANSETRON HCL 4 MG/2ML IJ SOLN
4.0000 mg | Freq: Three times a day (TID) | INTRAMUSCULAR | Status: DC | PRN
  Filled 2021-03-24: qty 2

## 2021-03-24 MED ORDER — OXYTOCIN-SODIUM CHLORIDE 30-0.9 UT/500ML-% IV SOLN
2.5000 [IU]/h | INTRAVENOUS | Status: DC
Start: 2021-03-24 — End: 2021-03-24
  Administered 2021-03-24: 30 [IU] via INTRAVENOUS
  Administered 2021-03-25: 2.5 [IU]/h via INTRAVENOUS
  Filled 2021-03-24: qty 500

## 2021-03-24 MED ORDER — KETOROLAC TROMETHAMINE 30 MG/ML IJ SOLN
INTRAMUSCULAR | Status: AC
  Administered 2021-03-25: 30 mg via INTRAVENOUS
  Filled 2021-03-24: qty 1

## 2021-03-24 MED ORDER — NALOXONE HCL 0.4 MG/ML IJ SOLN: 0.4000 mg | INTRAMUSCULAR | Status: DC | PRN

## 2021-03-24 MED ORDER — NALBUPHINE HCL 10 MG/ML IJ SOLN
5.0000 mg | Freq: Once | INTRAMUSCULAR | Status: DC | PRN
Start: 2021-03-24 — End: 2021-03-27

## 2021-03-24 MED ORDER — EPHEDRINE 5 MG/ML INJ: 10.0000 mg | INTRAVENOUS | Status: DC | PRN

## 2021-03-24 MED ORDER — LIDOCAINE HCL 2 % IJ SOLN
INTRAMUSCULAR | Status: AC
  Filled 2021-03-24: qty 20

## 2021-03-24 MED ORDER — PHENYLEPHRINE 40 MCG/ML (10ML) SYRINGE FOR IV PUSH (FOR BLOOD PRESSURE SUPPORT): 80.0000 ug | PREFILLED_SYRINGE | INTRAVENOUS | Status: DC | PRN

## 2021-03-24 MED ORDER — OXYCODONE-ACETAMINOPHEN 5-325 MG PO TABS: 1.0000 | ORAL_TABLET | ORAL | Status: DC | PRN

## 2021-03-24 MED ORDER — LACTATED RINGERS IV SOLN
INTRAVENOUS | Status: DC
  Administered 2021-03-24: 1000 mL via INTRAVENOUS

## 2021-03-24 MED ORDER — NALBUPHINE HCL 10 MG/ML IJ SOLN: 5.0000 mg | Freq: Once | INTRAMUSCULAR | Status: DC | PRN

## 2021-03-24 MED ORDER — LIDOCAINE-EPINEPHRINE (PF) 1.5 %-1:200000 IJ SOLN
INTRAMUSCULAR | Status: DC | PRN
Start: 2021-03-24 — End: 2021-03-24
  Administered 2021-03-24: 3 mL via EPIDURAL

## 2021-03-24 MED ORDER — FENTANYL-BUPIVACAINE-NACL 0.5-0.125-0.9 MG/250ML-% EP SOLN
EPIDURAL | Status: AC
  Filled 2021-03-24: qty 250

## 2021-03-24 MED ORDER — FENTANYL CITRATE (PF) 100 MCG/2ML IJ SOLN
INTRAMUSCULAR | Status: DC | PRN
  Administered 2021-03-24: 100 ug via EPIDURAL

## 2021-03-24 MED ORDER — BUPIVACAINE HCL (PF) 0.5 % IJ SOLN
INTRAMUSCULAR | Status: AC
  Filled 2021-03-24: qty 30

## 2021-03-24 MED ORDER — OXYTOCIN BOLUS FROM INFUSION
333.0000 mL | Freq: Once | INTRAVENOUS | Status: DC
  Administered 2021-03-24: 333 mL via INTRAVENOUS

## 2021-03-24 MED ORDER — OXYTOCIN-SODIUM CHLORIDE 30-0.9 UT/500ML-% IV SOLN
1.0000 m[IU]/min | INTRAVENOUS | Status: DC
Start: 2021-03-24 — End: 2021-03-24

## 2021-03-24 MED ORDER — KETOROLAC TROMETHAMINE 30 MG/ML IJ SOLN: 30.0000 mg | Freq: Four times a day (QID) | INTRAMUSCULAR | Status: DC

## 2021-03-24 MED ORDER — FENTANYL-BUPIVACAINE-NACL 0.5-0.125-0.9 MG/250ML-% EP SOLN: 12.0000 mL/h | EPIDURAL | Status: DC | PRN

## 2021-03-24 MED ORDER — MIDAZOLAM HCL 2 MG/2ML IJ SOLN
INTRAMUSCULAR | Status: AC
  Filled 2021-03-24: qty 2

## 2021-03-24 MED ORDER — OXYCODONE HCL 5 MG PO TABS
5.0000 mg | ORAL_TABLET | Freq: Four times a day (QID) | ORAL | Status: DC | PRN
  Filled 2021-03-24: qty 1

## 2021-03-24 MED ORDER — ACETAMINOPHEN 325 MG PO TABS: 650.0000 mg | ORAL_TABLET | ORAL | Status: DC | PRN

## 2021-03-24 MED ORDER — MORPHINE SULFATE (PF) 0.5 MG/ML IJ SOLN
INTRAMUSCULAR | Status: AC
  Filled 2021-03-24: qty 10

## 2021-03-24 MED ORDER — MORPHINE SULFATE (PF) 0.5 MG/ML IJ SOLN
INTRAMUSCULAR | Status: DC | PRN
  Administered 2021-03-24: 2.5 mg via EPIDURAL

## 2021-03-24 MED ORDER — SODIUM CHLORIDE 0.9 % IV SOLN
INTRAVENOUS | Status: DC | PRN
  Administered 2021-03-24: 50 ug/min via INTRAVENOUS

## 2021-03-24 MED ORDER — LIDOCAINE HCL (PF) 1 % IJ SOLN
INTRAMUSCULAR | Status: DC | PRN
  Administered 2021-03-24: 1 mL via SUBCUTANEOUS

## 2021-03-24 MED ORDER — SOD CITRATE-CITRIC ACID 500-334 MG/5ML PO SOLN
ORAL | Status: AC
  Administered 2021-03-24: 30 mL via ORAL
  Filled 2021-03-24: qty 15

## 2021-03-24 MED ORDER — SOD CITRATE-CITRIC ACID 500-334 MG/5ML PO SOLN: 30.0000 mL | ORAL | Status: DC | PRN

## 2021-03-24 MED ORDER — ACETAMINOPHEN 10 MG/ML IV SOLN
1000.0000 mg | Freq: Four times a day (QID) | INTRAVENOUS | Status: DC
  Administered 2021-03-25: 1000 mg via INTRAVENOUS
  Filled 2021-03-24 (×3): qty 100

## 2021-03-24 MED ORDER — OXYTOCIN-SODIUM CHLORIDE 30-0.9 UT/500ML-% IV SOLN
INTRAVENOUS | Status: AC
  Filled 2021-03-24: qty 500

## 2021-03-24 MED ORDER — TERBUTALINE SULFATE 1 MG/ML IJ SOLN
0.2500 mg | Freq: Once | INTRAMUSCULAR | Status: DC | PRN
Start: 2021-03-24 — End: 2021-03-24

## 2021-03-24 MED ORDER — CEFAZOLIN SODIUM-DEXTROSE 2-4 GM/100ML-% IV SOLN
2.0000 g | INTRAVENOUS | Status: AC
Start: 2021-03-24 — End: 2021-03-24
  Administered 2021-03-24: 2 g via INTRAVENOUS
  Filled 2021-03-24: qty 100

## 2021-03-24 MED ORDER — NALOXONE HCL 4 MG/10ML IJ SOLN
1.0000 ug/kg/h | INTRAVENOUS | Status: DC | PRN
  Filled 2021-03-24: qty 5

## 2021-03-24 MED ORDER — SODIUM CHLORIDE 0.9 % IV SOLN
500.0000 mg | Freq: Once | INTRAVENOUS | Status: AC
  Administered 2021-03-24: 500 mg via INTRAVENOUS

## 2021-03-24 MED ORDER — DEXAMETHASONE SODIUM PHOSPHATE 10 MG/ML IJ SOLN
INTRAMUSCULAR | Status: DC | PRN
  Administered 2021-03-24: 10 mg via INTRAVENOUS

## 2021-03-24 MED ORDER — PHENYLEPHRINE HCL (PRESSORS) 10 MG/ML IV SOLN
INTRAVENOUS | Status: DC | PRN
  Administered 2021-03-24: 100 ug via INTRAVENOUS

## 2021-03-24 MED ORDER — MIDAZOLAM HCL 2 MG/2ML IJ SOLN
INTRAMUSCULAR | Status: DC | PRN
  Administered 2021-03-24 (×2): 1 mg via INTRAVENOUS

## 2021-03-24 MED ORDER — FENTANYL CITRATE (PF) 100 MCG/2ML IJ SOLN: 25.0000 ug | INTRAMUSCULAR | Status: DC | PRN

## 2021-03-24 MED ORDER — OXYCODONE HCL 5 MG/5ML PO SOLN
5.0000 mg | Freq: Once | ORAL | Status: DC | PRN
Start: 2021-03-24 — End: 2021-03-25

## 2021-03-24 MED ORDER — SODIUM CHLORIDE 0.9 % IV SOLN
INTRAVENOUS | Status: AC
  Filled 2021-03-24: qty 500

## 2021-03-24 MED ORDER — NALBUPHINE HCL 10 MG/ML IJ SOLN: 5.0000 mg | INTRAMUSCULAR | Status: DC | PRN

## 2021-03-24 MED ORDER — LIDOCAINE HCL (PF) 2 % IJ SOLN
INTRAMUSCULAR | Status: DC | PRN
  Administered 2021-03-24 (×6): 100 mg via EPIDURAL

## 2021-03-24 MED ORDER — METHYLERGONOVINE MALEATE 0.2 MG/ML IJ SOLN
INTRAMUSCULAR | Status: DC | PRN
Start: 2021-03-24 — End: 2021-03-24
  Administered 2021-03-24: .2 mg via INTRAMUSCULAR

## 2021-03-24 MED ORDER — BUPIVACAINE HCL (PF) 0.5 % IJ SOLN: INTRAMUSCULAR | Status: DC | PRN

## 2021-03-24 MED ORDER — SOD CITRATE-CITRIC ACID 500-334 MG/5ML PO SOLN: 30.0000 mL | ORAL | Status: AC

## 2021-03-24 MED ORDER — LACTATED RINGERS IV SOLN: 500.0000 mL | INTRAVENOUS | Status: DC | PRN

## 2021-03-24 MED ORDER — BUPIVACAINE LIPOSOME 1.3 % IJ SUSP
INTRAMUSCULAR | Status: AC
  Filled 2021-03-24: qty 20

## 2021-03-24 MED ORDER — DIPHENHYDRAMINE HCL 50 MG/ML IJ SOLN: 12.5000 mg | INTRAMUSCULAR | Status: DC | PRN

## 2021-03-24 MED ORDER — FENTANYL CITRATE (PF) 100 MCG/2ML IJ SOLN
INTRAMUSCULAR | Status: AC
  Filled 2021-03-24: qty 2

## 2021-03-24 MED ORDER — SODIUM CHLORIDE 0.9 % IV SOLN
INTRAVENOUS | Status: DC | PRN
  Administered 2021-03-24 (×2): 5 mL via EPIDURAL

## 2021-03-24 MED ORDER — FENTANYL 2.5 MCG/ML BUPIVACAINE 1/10 % EPIDURAL INFUSION (WH - ANES)
INTRAMUSCULAR | Status: DC | PRN
  Administered 2021-03-24: 12 mL/h via EPIDURAL

## 2021-03-24 SURGICAL SUPPLY — 28 items
BACTOSHIELD CHG 4% 4OZ (MISCELLANEOUS) ×1
CHLORAPREP W/TINT 26 (MISCELLANEOUS) ×2 IMPLANT
DRSG TELFA 3X8 NADH (GAUZE/BANDAGES/DRESSINGS) ×2 IMPLANT
ELECT REM PT RETURN 9FT ADLT (ELECTROSURGICAL) ×2
ELECTRODE REM PT RTRN 9FT ADLT (ELECTROSURGICAL) ×1 IMPLANT
GAUZE SPONGE 4X4 12PLY STRL (GAUZE/BANDAGES/DRESSINGS) ×2 IMPLANT
GOWN STRL REUS W/ TWL LRG LVL3 (GOWN DISPOSABLE) ×3 IMPLANT
GOWN STRL REUS W/TWL LRG LVL3 (GOWN DISPOSABLE) ×3
HANDLE YANKAUER SUCT BULB TIP (MISCELLANEOUS) ×2 IMPLANT
MANIFOLD NEPTUNE II (INSTRUMENTS) ×2 IMPLANT
MAT PREVALON FULL STRYKER (MISCELLANEOUS) ×2 IMPLANT
NEEDLE HYPO 25GX1X1/2 BEV (NEEDLE) ×2 IMPLANT
NS IRRIG 1000ML POUR BTL (IV SOLUTION) ×2 IMPLANT
PACK C SECTION AR (MISCELLANEOUS) ×2 IMPLANT
PAD OB MATERNITY 4.3X12.25 (PERSONAL CARE ITEMS) ×2 IMPLANT
PAD PREP 24X41 OB/GYN DISP (PERSONAL CARE ITEMS) ×2 IMPLANT
PENCIL SMOKE EVACUATOR (MISCELLANEOUS) ×2 IMPLANT
SCRUB CHG 4% DYNA-HEX 4OZ (MISCELLANEOUS) ×1 IMPLANT
STAPLER INSORB 30 2030 C-SECTI (MISCELLANEOUS) ×2 IMPLANT
SUT MNCRL 4-0 (SUTURE) ×1
SUT MNCRL 4-0 27XMFL (SUTURE) ×1
SUT VIC AB 0 CT1 36 (SUTURE) ×4 IMPLANT
SUT VIC AB 0 CTX 36 (SUTURE) ×2
SUT VIC AB 0 CTX36XBRD ANBCTRL (SUTURE) ×2 IMPLANT
SUT VIC AB 2-0 SH 27 (SUTURE) ×2
SUT VIC AB 2-0 SH 27XBRD (SUTURE) ×2 IMPLANT
SUTURE MNCRL 4-0 27XMF (SUTURE) ×1 IMPLANT
SYR 30ML LL (SYRINGE) ×4 IMPLANT

## 2021-03-24 NOTE — Anesthesia Procedure Notes (Signed)
Epidural Patient location during procedure: OB Start time: 03/24/2021 7:06 PM End time: 03/24/2021 7:10 PM  Staffing Anesthesiologist: Zhana Jeangilles, Cleda Mccreedy, MD Performed: anesthesiologist   Preanesthetic Checklist Completed: patient identified, IV checked, site marked, risks and benefits discussed, surgical consent, monitors and equipment checked, pre-op evaluation and timeout performed  Epidural Patient position: sitting Prep: ChloraPrep Patient monitoring: heart rate, continuous pulse ox and blood pressure Approach: midline Location: L3-L4 Injection technique: LOR saline  Needle:  Needle type: Tuohy  Needle gauge: 17 G Needle length: 9 cm and 9 Needle insertion depth: 5 cm Catheter type: closed end flexible Catheter size: 19 Gauge Catheter at skin depth: 10 cm Test dose: negative and 1.5% lidocaine with Epi 1:200 K  Assessment Sensory level: T10 Events: blood not aspirated, injection not painful, no injection resistance, no paresthesia and negative IV test  Additional Notes 1 attempt Pt. Evaluated and documentation done after procedure finished. Patient identified. Risks/Benefits/Options discussed with patient including but not limited to bleeding, infection, nerve damage, paralysis, failed block, incomplete pain control, headache, blood pressure changes, nausea, vomiting, reactions to medication both or allergic, itching and postpartum back pain. Confirmed with bedside nurse the patient's most recent platelet count. Confirmed with patient that they are not currently taking any anticoagulation, have any bleeding history or any family history of bleeding disorders. Patient expressed understanding and wished to proceed. All questions were answered. Sterile technique was used throughout the entire procedure. Please see nursing notes for vital signs. Test dose was given through epidural catheter and negative prior to continuing to dose epidural or start infusion. Warning signs of  high block given to the patient including shortness of breath, tingling/numbness in hands, complete motor block, or any concerning symptoms with instructions to call for help. Patient was given instructions on fall risk and not to get out of bed. All questions and concerns addressed with instructions to call with any issues or inadequate analgesia.    Patient tolerated the insertion well without immediate complications.Reason for block:procedure for pain

## 2021-03-24 NOTE — Transfer of Care (Signed)
Immediate Anesthesia Transfer of Care Note  Patient: Suzanne Wilson  Procedure(s) Performed: CESAREAN SECTION  Patient Location: Mother/Baby  Anesthesia Type:Epidural  Level of Consciousness: awake and pateint uncooperative  Airway & Oxygen Therapy: Patient Spontanous Breathing  Post-op Assessment: Report given to RN and Post -op Vital signs reviewed and stable  Post vital signs: Reviewed  Last Vitals:  Vitals Value Taken Time  BP 133/78 03/24/21 2309  Temp 36.4 C 03/24/21 2309  Pulse 115 03/24/21 2309  Resp 18 03/24/21 2310  SpO2 99 % 03/24/21 2309  Vitals shown include unvalidated device data.  Last Pain:  Vitals:   03/24/21 1912  TempSrc: Oral  PainSc:          Complications: No notable events documented.
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
## 2021-03-24 NOTE — Op Note (Signed)
  Cesarean Section Procedure Note  Date of procedure: 03/24/2021   Pre-operative Diagnosis: Intrauterine pregnancy at [redacted]w[redacted]d;  - frank breech presentation - active labor  Post-operative Diagnosis: same, delivered.  Procedure: Primary Low Transverse Cesarean Section through Pfannenstiel incision  Surgeon: Christeen Douglas, MD  Assistant(s):  CST  Anesthesia: Spinal anesthesia  Anesthesiologist: Piscitello, Cleda Mccreedy, MD Anesthesiologist: Randa Ngo Cleda Mccreedy, MD CRNA: Mathews Argyle, CRNA  Estimated Blood Loss:  600         Drains: foley         Total IV Fluids:  Urine Output:         Specimens: cord blood         Complications:  None; patient tolerated the procedure well.         Disposition: PACU - hemodynamically stable.         Condition: stable  Findings:  A female infant in frank breech presentation. Amniotic fluid - Clear  Birth weight 3230 g.  Apgars of 9 and 9 at one and five minutes respectively.  Intact placenta with a three-vessel cord.  Grossly normal uterus, tubes and ovaries bilaterally. No intraabdominal adhesions were noted. Pt significantly anxious at the start of the procedure  Indications: malpresentation: frank breech  Procedure Details  The patient was taken to Operating Room, identified as the correct patient and the procedure verified as C-Section Delivery. A formal Time Out was held with all team members present and in agreement.  After induction of spinal anesthesia, the patient was draped and prepped in the usual sterile manner. A Pfannenstiel skin incision was made and carried down through the subcutaneous tissue to the fascia. Fascial incision was made and extended transversely with the Mayo scissors. The fascia was separated from the underlying rectus tissue superiorly and inferiorly. The peritoneum was identified and entered bluntly. Peritoneal incision was extended longitudinally. The utero-vesical peritoneal reflection was  incised transversely and a bladder flap was created digitally.   A low transverse hysterotomy was made. The fetus was delivered in a breech presentation atraumatically, using standard breech manuvers. The umbilical cord was clamped x2 and cut and the infant was handed to the awaiting pediatricians. The placenta was removed intact and appeared normal, intact, and with a 3-vessel cord.   The uterus was exteriorized and cleared of all clot and debris. The hysterotomy was closed with running sutures of 0-Vicryl. A second imbricating layer was placed with the same suture. Excellent hemostasis was observed. The peritoneal cavity was cleared of all clots and debris. The uterus was returned to the abdomen.   The pelvis was irrigated and again, excellent hemostasis was noted. The fascia was then reapproximated with running sutures of 0 Vicryl. The skin was reapproximated with Ensorb absorbable staples. Exparel along the fascial and skin lines.  Instrument, sponge, and needle counts were correct prior to the abdominal closure and at the conclusion of the case.   The patient tolerated the procedure well and was transferred to the recovery room in stable condition.   Christeen Douglas, MD 03/24/2021
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
## 2021-03-24 NOTE — Progress Notes (Signed)
Patient in recovery with apparent anxiety with eyes closed, gasping and wheezing, with sensation of vomiting but just spiting brought out. Vitals are stable with 100% O2 sat. Reponsive to questions and commands. Family denies hx of psych or PTSD.  Vitals:   03/24/21 1922 03/24/21 2309  BP: 127/87 133/78  Pulse: 87 (!) 115  Resp:  18  Temp:  (!) 97.5 F (36.4 C)  SpO2: 98% 99%   Dressing clean and dry Lungs CTAB Cards: Tachy, regular rhythm  A/P: Apparent anxiety and pain in recovery, without vital sign instability and with 100% O2 sats. DDx panic attack, high epidural, asthma (does have hx), aspiration.   - bedside supportive care given - will continue monitoring with O2 sats x24hrs, HR - consider anxiolytics as she recovers more and if she desires - will order albuterol inhaler PRN - family at the bedside

## 2021-03-24 NOTE — OB Triage Note (Signed)
Patient c/o ctx since 3:30 pm. She was seen at [facility] today and her cervix was 4-5 cm, intact. Some spotting from her vagina before and after her cervical check. 9 out of 10 pain scale.

## 2021-03-25 ENCOUNTER — Encounter: Payer: Self-pay | Admitting: Obstetrics and Gynecology

## 2021-03-25 DIAGNOSIS — F410 Panic disorder [episodic paroxysmal anxiety] without agoraphobia: Secondary | ICD-10-CM | POA: Diagnosis not present

## 2021-03-25 LAB — CBC
HCT: 32.9 % — ABNORMAL LOW (ref 36.0–46.0)
Hemoglobin: 10.9 g/dL — ABNORMAL LOW (ref 12.0–15.0)
MCH: 27.4 pg (ref 26.0–34.0)
MCHC: 33.1 g/dL (ref 30.0–36.0)
MCV: 82.7 fL (ref 80.0–100.0)
Platelets: 219 10*3/uL (ref 150–400)
RBC: 3.98 MIL/uL (ref 3.87–5.11)
RDW: 14.1 % (ref 11.5–15.5)
WBC: 15.6 10*3/uL — ABNORMAL HIGH (ref 4.0–10.5)
nRBC: 0 % (ref 0.0–0.2)

## 2021-03-25 LAB — RPR: RPR Ser Ql: NONREACTIVE

## 2021-03-25 MED ORDER — MEASLES, MUMPS & RUBELLA VAC IJ SOLR
0.5000 mL | Freq: Once | INTRAMUSCULAR | Status: DC
  Filled 2021-03-25: qty 0.5

## 2021-03-25 MED ORDER — SIMETHICONE 80 MG PO CHEW
80.0000 mg | CHEWABLE_TABLET | ORAL | Status: DC | PRN
  Administered 2021-03-27: 80 mg via ORAL
  Filled 2021-03-25: qty 1

## 2021-03-25 MED ORDER — GABAPENTIN 300 MG PO CAPS
300.0000 mg | ORAL_CAPSULE | Freq: Every day | ORAL | Status: DC
  Administered 2021-03-25 – 2021-03-26 (×2): 300 mg via ORAL
  Filled 2021-03-25 (×3): qty 1

## 2021-03-25 MED ORDER — KETOROLAC TROMETHAMINE 30 MG/ML IJ SOLN
30.0000 mg | Freq: Four times a day (QID) | INTRAMUSCULAR | Status: AC
  Administered 2021-03-25 – 2021-03-26 (×3): 30 mg via INTRAVENOUS
  Filled 2021-03-25 (×4): qty 1

## 2021-03-25 MED ORDER — ACETAMINOPHEN 325 MG PO TABS: 650.0000 mg | ORAL_TABLET | Freq: Four times a day (QID) | ORAL | Status: DC

## 2021-03-25 MED ORDER — FERROUS SULFATE 325 (65 FE) MG PO TABS
325.0000 mg | ORAL_TABLET | Freq: Two times a day (BID) | ORAL | Status: DC
  Administered 2021-03-25 – 2021-03-27 (×4): 325 mg via ORAL
  Filled 2021-03-25 (×4): qty 1

## 2021-03-25 MED ORDER — FLEET ENEMA 7-19 GM/118ML RE ENEM: 1.0000 | ENEMA | Freq: Every day | RECTAL | Status: DC | PRN

## 2021-03-25 MED ORDER — IBUPROFEN 600 MG PO TABS
600.0000 mg | ORAL_TABLET | Freq: Four times a day (QID) | ORAL | Status: DC
  Administered 2021-03-26 – 2021-03-27 (×5): 600 mg via ORAL
  Filled 2021-03-25 (×5): qty 1

## 2021-03-25 MED ORDER — LACTATED RINGERS IV SOLN: INTRAVENOUS | Status: DC

## 2021-03-25 MED ORDER — SODIUM CHLORIDE 0.9 % IV SOLN
8.0000 mg | Freq: Three times a day (TID) | INTRAVENOUS | Status: DC | PRN
  Administered 2021-03-25: 8 mg via INTRAVENOUS
  Filled 2021-03-25 (×3): qty 4

## 2021-03-25 MED ORDER — DIBUCAINE (PERIANAL) 1 % EX OINT: 1.0000 "application " | TOPICAL_OINTMENT | CUTANEOUS | Status: DC | PRN

## 2021-03-25 MED ORDER — DIPHENHYDRAMINE HCL 25 MG PO CAPS: 25.0000 mg | ORAL_CAPSULE | Freq: Four times a day (QID) | ORAL | Status: DC | PRN

## 2021-03-25 MED ORDER — COCONUT OIL OIL
1.0000 "application " | TOPICAL_OIL | Status: DC | PRN
  Filled 2021-03-25 (×2): qty 120

## 2021-03-25 MED ORDER — BISACODYL 10 MG RE SUPP
10.0000 mg | Freq: Every day | RECTAL | Status: DC | PRN
  Filled 2021-03-25: qty 1

## 2021-03-25 MED ORDER — SENNOSIDES-DOCUSATE SODIUM 8.6-50 MG PO TABS
2.0000 | ORAL_TABLET | ORAL | Status: DC
  Administered 2021-03-25 – 2021-03-27 (×3): 2 via ORAL
  Filled 2021-03-25 (×3): qty 2

## 2021-03-25 MED ORDER — OXYCODONE HCL 5 MG PO TABS
5.0000 mg | ORAL_TABLET | ORAL | Status: DC | PRN
  Administered 2021-03-25 (×2): 10 mg via ORAL
  Administered 2021-03-25: 5 mg via ORAL
  Administered 2021-03-26: 10 mg via ORAL
  Administered 2021-03-26: 5 mg via ORAL
  Administered 2021-03-26: 10 mg via ORAL
  Administered 2021-03-26: 5 mg via ORAL
  Administered 2021-03-27 (×2): 10 mg via ORAL
  Filled 2021-03-25: qty 2
  Filled 2021-03-25: qty 1
  Filled 2021-03-25 (×2): qty 2
  Filled 2021-03-25: qty 1
  Filled 2021-03-25 (×3): qty 2

## 2021-03-25 MED ORDER — PNEUMOCOCCAL VAC POLYVALENT 25 MCG/0.5ML IJ INJ
0.5000 mL | INJECTION | INTRAMUSCULAR | Status: DC
  Filled 2021-03-25: qty 0.5

## 2021-03-25 MED ORDER — OXYTOCIN-SODIUM CHLORIDE 30-0.9 UT/500ML-% IV SOLN: 2.5000 [IU]/h | INTRAVENOUS | Status: AC

## 2021-03-25 MED ORDER — TETANUS-DIPHTH-ACELL PERTUSSIS 5-2.5-18.5 LF-MCG/0.5 IM SUSY
0.5000 mL | PREFILLED_SYRINGE | Freq: Once | INTRAMUSCULAR | Status: DC
  Filled 2021-03-25: qty 0.5

## 2021-03-25 MED ORDER — WITCH HAZEL-GLYCERIN EX PADS: 1.0000 "application " | MEDICATED_PAD | CUTANEOUS | Status: DC | PRN

## 2021-03-25 MED ORDER — MENTHOL 3 MG MT LOZG
1.0000 | LOZENGE | OROMUCOSAL | Status: DC | PRN
  Filled 2021-03-25: qty 9

## 2021-03-25 MED ORDER — ONDANSETRON HCL 4 MG/2ML IJ SOLN
4.0000 mg | Freq: Once | INTRAMUSCULAR | Status: AC
  Administered 2021-03-25: 4 mg via INTRAVENOUS

## 2021-03-25 MED ORDER — PRENATAL MULTIVITAMIN CH
1.0000 | ORAL_TABLET | Freq: Every day | ORAL | Status: DC
  Administered 2021-03-25 – 2021-03-27 (×3): 1 via ORAL
  Filled 2021-03-25 (×2): qty 1

## 2021-03-25 MED ORDER — ACETAMINOPHEN 500 MG PO TABS
1000.0000 mg | ORAL_TABLET | Freq: Four times a day (QID) | ORAL | Status: DC
  Administered 2021-03-25 – 2021-03-27 (×9): 1000 mg via ORAL
  Filled 2021-03-25 (×9): qty 2

## 2021-03-25 MED ORDER — SIMETHICONE 80 MG PO CHEW
80.0000 mg | CHEWABLE_TABLET | Freq: Three times a day (TID) | ORAL | Status: DC
  Administered 2021-03-25 – 2021-03-27 (×8): 80 mg via ORAL
  Filled 2021-03-25 (×8): qty 1

## 2021-03-25 NOTE — Progress Notes (Signed)
Patient and family educated throughout the day to ambulate around room and hallway but patient resistance and refusing, stating that she is scared of the pain when she moves. RN will continue to monitor, educate and encourage ambulation.
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
## 2021-03-25 NOTE — Lactation Note (Signed)
This note was copied from a baby's chart. Lactation Consultation Note  Patient Name: Suzanne Wilson [id number] Date: 03/25/2021 Reason for consult: Follow-up assessment Age:30 hours  Maternal Data    Feeding Mother's Current Feeding Choice: Breast Milk Mom nursing baby on right breast, baby rooting and latches easily in cradle hold without assistance from [place], nursed total of 41mn. LATCH Score Latch: Grasps breast easily, tongue down, lips flanged, rhythmical sucking.  Audible Swallowing: Spontaneous and intermittent  Type of Nipple: Everted at rest and after stimulation  Comfort (Breast/Nipple): Filling, red/small blisters or bruises, mild/mod discomfort  Hold (Positioning): No assistance needed to correctly position infant at breast.  LATCH Score: 9   Lactation Tools Discussed/Used    Interventions Interventions: DEBP;Hand pump;Coconut oil  Discharge Pump: DEBP;Manual (given breast pump kit for [employer] symphony pump per pt request)  Consult Status Consult Status: PRN    MFerol Luz7/29/2022, 6:14 PM

## 2021-03-25 NOTE — Anesthesia Postprocedure Evaluation (Signed)
Anesthesia Post Note  Patient: Suzanne Wilson  Procedure(s) Performed: CESAREAN SECTION  Patient location during evaluation: Mother Baby Anesthesia Type: Epidural and Spinal Level of consciousness: oriented and awake and alert Pain management: pain level controlled Vital Signs Assessment: post-procedure vital signs reviewed and stable Respiratory status: spontaneous breathing and respiratory function stable Cardiovascular status: blood pressure returned to baseline and stable Postop Assessment: no headache, no backache, no apparent nausea or vomiting and able to ambulate Anesthetic complications: no   No notable events documented.   Last Vitals:  Vitals:   03/25/21 0600 03/25/21 0615  BP:    Pulse:    Resp:    Temp:    SpO2: 100% 97%    Last Pain:  Vitals:   03/25/21 0600  TempSrc:   PainSc: 6                  Rosanne Gutting

## 2021-03-25 NOTE — Lactation Note (Addendum)
This note was copied from a baby's chart. Lactation Consultation Note  Patient Name: Suzanne Wilson [id number] Date: 03/25/2021 Reason for consult: Initial assessment;Term Age:30 hours  Maternal Data Does the patient have breastfeeding experience prior to this delivery?: Yes How long did the patient breastfeed?: 2 yrs  Feeding Mother's Current Feeding Choice: Breast Milk Mom feeding baby at breast when room entered on left breast in cradle hold, mom states baby latches easily LATCH Score Latch: Grasps breast easily, tongue down, lips flanged, rhythmical sucking.  Audible Swallowing: A few with stimulation  Type of Nipple: Everted at rest and after stimulation  Comfort (Breast/Nipple): Soft / non-tender  Hold (Positioning): No assistance needed to correctly position infant at breast.  LATCH Score: 9   Lactation Tools Discussed/Used DEBP, when to pump   Interventions  LC name and no written on white board  Discharge Pump: Personal (hand pump) WIC Program: Yes  Consult Status Consult Status: PRN    Dyann Kief 03/25/2021, 1:42 PM

## 2021-03-25 NOTE — Anesthesia Post-op Follow-up Note (Signed)
  Anesthesia Pain Follow-up Note  Patient: Suzanne Wilson  Day #: 1  Date of Follow-up: 03/25/2021 Time: 7:27 AM  Last Vitals:  Vitals:   03/25/21 0600 03/25/21 0615  BP:    Pulse:    Resp:    Temp:    SpO2: 100% 97%    Level of Consciousness: alert  Pain: none   Side Effects:None  Catheter Site Exam:clean, dry, no drainage     Plan: D/C from anesthesia care at surgeon's request  Rosanne Gutting

## 2021-03-26 NOTE — Progress Notes (Signed)
Pt complains of pain throughout this shift. Patient was encouraged to ambulate in room and out in the hallway. Will continue to encourage movement and educate the importance.

## 2021-03-26 NOTE — Progress Notes (Signed)
Post Partum Day 2  Subjective: Pt reports pain, we are still working on managing her pain.  Was encouraged to ambulate more today, tolerating regular diet, and voiding without difficulty.   No fever/chills, chest pain, shortness of breath, nausea/vomiting, or leg pain. No nipple or breast pain. No headache, visual changes, or RUQ/epigastric pain.  Objective: BP 100/63 (BP Location: Left Arm)   Pulse 66   Temp 98.2 F (36.8 C) (Oral)   Resp 18   Ht 5' 2.5" (1.588 m)   Wt 71 kg   SpO2 97%   Breastfeeding Unknown   BMI 28.19 kg/m    Physical Exam:  General: alert and cooperative Breasts: soft/nontender CV: RRR Pulm: nl effort Abdomen: soft, non-tender Uterine Fundus: firm Incision: dressing c/d/i Perineum: intact Lochia: appropriate DVT Evaluation: No evidence of DVT seen on physical exam. Edinburgh:  Edinburgh Postnatal Depression Scale Screening Tool 03/25/2021  I have been able to laugh and see the funny side of things. (No Data)     Recent Labs    03/24/21 1753 03/25/21 0744  HGB 12.3 10.9*  HCT 36.6 32.9*  WBC 5.1 15.6*  PLT 230 219    Assessment/Plan: 30 y.o. [id number] postpartum day # 1  - Continue scheduled pain medication and ambulation today  - Seen by Dr. Toni Amend from psychiatry, pt reports appreciation for visit and would like to continue with them outpatient.  -Continue routine postpartum care  -Acute blood loss anemia - hemodynamically stable and asymptomatic; start PO ferrous sulfate BID with stool softeners   Plan for discharge tomorrow  Disposition: Continue inpatient postpartum care    LOS: 2 days   Leonila Speranza, CNM 03/26/2021, 10:57 AM

## 2021-03-27 DIAGNOSIS — D62 Acute posthemorrhagic anemia: Secondary | ICD-10-CM | POA: Diagnosis not present

## 2021-03-27 DIAGNOSIS — Z98891 History of uterine scar from previous surgery: Secondary | ICD-10-CM

## 2021-03-27 MED ORDER — IBUPROFEN 600 MG PO TABS: 600.0000 mg | ORAL_TABLET | Freq: Four times a day (QID) | ORAL | Status: AC | PRN

## 2021-03-27 MED ORDER — SENNOSIDES-DOCUSATE SODIUM 8.6-50 MG PO TABS: 2.0000 | ORAL_TABLET | Freq: Every evening | ORAL | Status: AC | PRN

## 2021-03-27 MED ORDER — OXYCODONE HCL 5 MG PO TABS: 5.0000 mg | ORAL_TABLET | ORAL | 0 refills | Status: AC | PRN

## 2021-03-27 MED ORDER — FERROUS SULFATE 325 (65 FE) MG PO TABS: 325.0000 mg | ORAL_TABLET | Freq: Every day | ORAL | Status: AC

## 2021-03-27 MED ORDER — SIMETHICONE 80 MG PO CHEW: 80.0000 mg | CHEWABLE_TABLET | ORAL | Status: AC | PRN

## 2021-03-27 MED ORDER — ACETAMINOPHEN 500 MG PO TABS: 500.0000 mg | ORAL_TABLET | Freq: Four times a day (QID) | ORAL | Status: AC | PRN
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
## 2021-03-27 NOTE — TOC Initial Note (Signed)
Transition of Care [facility]) - Initial/Assessment Note    Patient Details  Name: Suzanne Wilson MRN: [id number] Date of Birth: 1991-04-21  Transition of Care [facility] CM/SW Contact:    Marina Goodell Phone Number: [phone] 03/27/2021, 12:31 PM  Clinical Narrative:                  CSW spoke with patient who is three days post partum.  Patient scored high in the [place] scale and expressed concerns and exhibited anxiety.  Patient stated she has access to physicians and is able to schedule and attend doctor's appointments.  Patient stated she was interest in speaking with a mental health therapist.  CSW stated I would e-mail her a list of local out patient mental health therapists.  Patient stated she has family supports and assistance from the baby's father. Patient stated she has everything she needs. CSW informed patient about different resources in the community. Patient has no further questions. CSW updated Unit RN.        Patient Goals and CMS Choice        Expected Discharge Plan and Services           Expected Discharge Date: 03/27/21                                    Prior Living Arrangements/Services                       Activities of Daily Living Home Assistive Devices/Equipment: None ADL Screening (condition at time of admission) Patient's cognitive ability adequate to safely complete daily activities?: Yes Is the patient deaf or have difficulty hearing?: No Does the patient have difficulty seeing, even when wearing glasses/contacts?: No Does the patient have difficulty concentrating, remembering, or making decisions?: No Patient able to express need for assistance with ADLs?: Yes Does the patient have difficulty dressing or bathing?: No Independently performs ADLs?: Yes (appropriate for developmental age) Does the patient have difficulty walking or climbing stairs?: No Weakness of Legs: None Weakness of Arms/Hands: None  Permission  Sought/Granted                  Emotional Assessment              Admission diagnosis:  Labor and delivery, indication for care [O75.9] Patient Active Problem List   Diagnosis Date Noted   S/P C-section 03/27/2021   Acute blood loss anemia 03/27/2021   Anxiety attack 03/25/2021   Uterine contractions 03/08/2021   Pelvic pressure in pregnancy 11/16/2020   PCP:  [facility]:   [id number] [place], [place] - [address] [place] [address] Phone: [phone] Fax: [phone]     Social Determinants of Health (SDOH) Interventions    Readmission Risk Interventions No flowsheet data found.

## 2021-03-27 NOTE — Progress Notes (Signed)
Pt discharged with infant. Discharge instructions, prescriptions, and follow up appointments given to and reviewed with patient. Pt verbalized understanding. Escorted out by auxillary.  

## 2021-03-27 NOTE — Progress Notes (Signed)
Entered patient's room to reassess pts pain from PRN given. Pt stated that it was a 6 (from a 9 previously), but she needed to go to the bathroom and had been holding it. Helped pt out of the bed. Upon standing pt stated that she was dizzy. We walked slowly to the bathroom and patient was not able to hold it. Upon reaching the bathroom patient was still complaining of lightheadedness and dizziness. Encouraged deep breathing and comforted the pt. Patient was tearful and said that she was anxious because of her situation. Obtained vital signs while in the bathroom and they were stable. Pt urinated once more while in the bathroom. Assisted with getting back to the bed, patient was still dizzy while walking. Once back into the bed I reassessed the pts pain and she stated that it was now a 4 and no longer felt dizzy. I bladder scanned the patient and it was 82mL. Encouraged patient to hit the call bell the next time she needs to get up and to not wait to go to the bathroom, as well as encouraged rest.

## 2021-04-01 ENCOUNTER — Other Ambulatory Visit: Payer: Self-pay | Admitting: Obstetrics and Gynecology

## 2021-06-03 ENCOUNTER — Other Ambulatory Visit (HOSPITAL_COMMUNITY): Payer: Self-pay | Admitting: Physician Assistant

## 2021-06-03 ENCOUNTER — Other Ambulatory Visit (HOSPITAL_BASED_OUTPATIENT_CLINIC_OR_DEPARTMENT_OTHER): Payer: Self-pay | Admitting: Physician Assistant

## 2021-06-03 DIAGNOSIS — R42 Dizziness and giddiness: Secondary | ICD-10-CM

## 2021-06-03 DIAGNOSIS — R519 Headache, unspecified: Secondary | ICD-10-CM

## 2021-06-03 DIAGNOSIS — R4020 Unspecified coma: Secondary | ICD-10-CM

## 2021-06-04 ENCOUNTER — Other Ambulatory Visit: Payer: Self-pay | Admitting: Physician Assistant

## 2021-06-04 DIAGNOSIS — R4020 Unspecified coma: Secondary | ICD-10-CM

## 2021-06-04 DIAGNOSIS — R519 Headache, unspecified: Secondary | ICD-10-CM

## 2021-06-04 DIAGNOSIS — R42 Dizziness and giddiness: Secondary | ICD-10-CM

## 2021-06-23 ENCOUNTER — Ambulatory Visit
Admission: RE | Admit: 2021-06-23 | Discharge: 2021-06-23 | Disposition: A | Discharge status: Home / Self Care | Payer: BC Managed Care – PPO | Source: Ambulatory Visit | Attending: Physician Assistant | Admitting: Physician Assistant

## 2021-06-23 ENCOUNTER — Other Ambulatory Visit: Payer: Self-pay

## 2021-06-23 DIAGNOSIS — R42 Dizziness and giddiness: Secondary | ICD-10-CM

## 2021-06-23 DIAGNOSIS — R4020 Unspecified coma: Secondary | ICD-10-CM

## 2021-06-23 DIAGNOSIS — R519 Headache, unspecified: Secondary | ICD-10-CM

## 2021-06-23 MED ORDER — GADOBENATE DIMEGLUMINE 529 MG/ML IV SOLN
14.0000 mL | Freq: Once | INTRAVENOUS | Status: AC | PRN
  Administered 2021-06-23: 14 mL via INTRAVENOUS

## 2022-06-13 IMAGING — US US OB < 14 WEEKS - US OB TV
1 series · 13 of 28 positions shown · non-contrast
Comparison: None.

CLINICAL DATA: Vaginal bleeding for 1 week

EXAM:
OBSTETRIC <14 WK US AND TRANSVAGINAL OB US
TECHNIQUE: Both transabdominal and transvaginal ultrasound examinations were
performed for complete evaluation of the gestation as well as the
maternal uterus, adnexal regions, and pelvic cul-de-sac.
Transvaginal technique was performed to assess early pregnancy.

[Series 1: us ob less than 14 weeks with ob transvaginal · 13 of 134 slices shown]
[im 5/134]
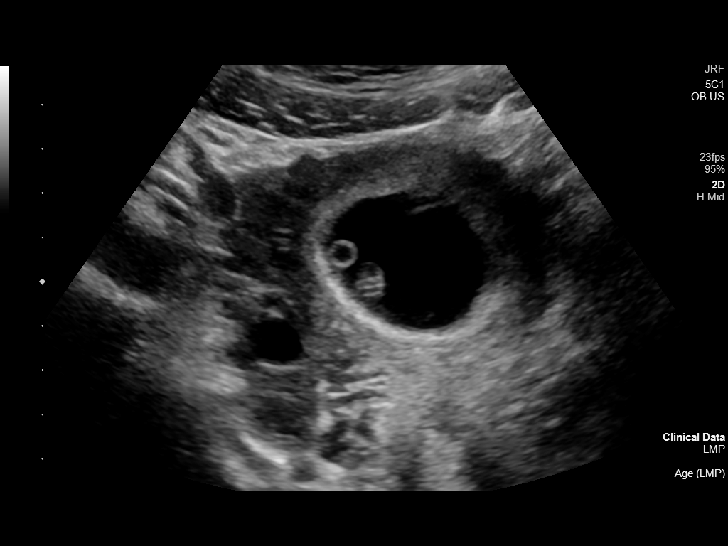
[im 15/134]
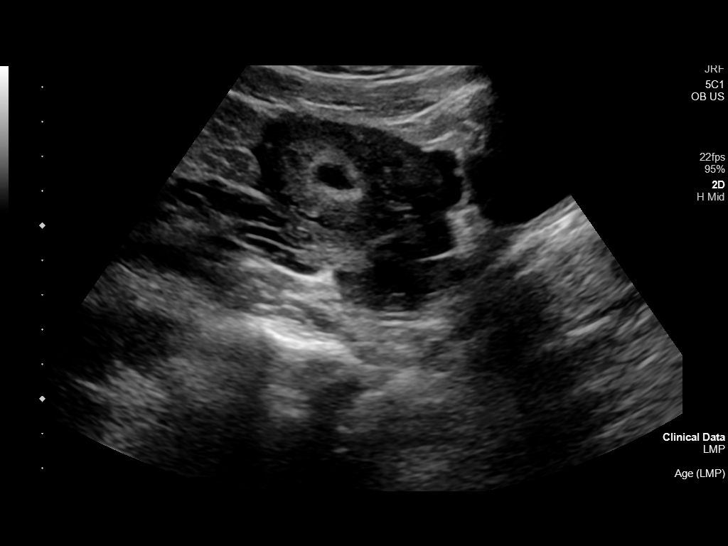
[im 25/134]
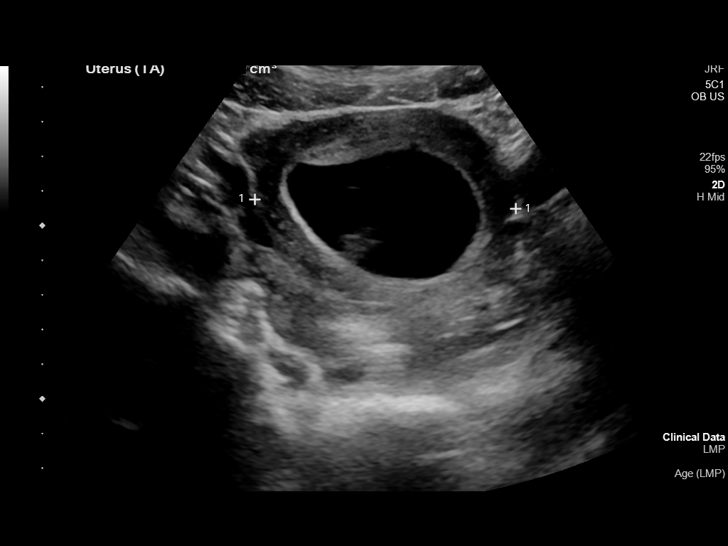
[im 35/134]
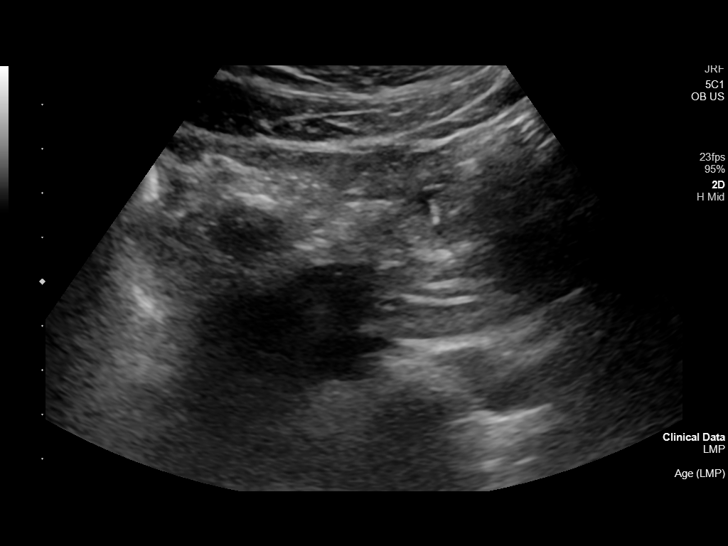
[im 45/134]
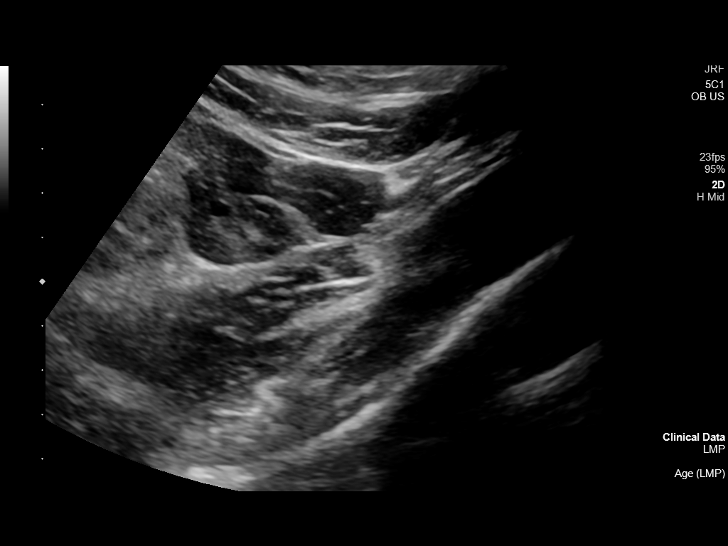
[im 55/134]
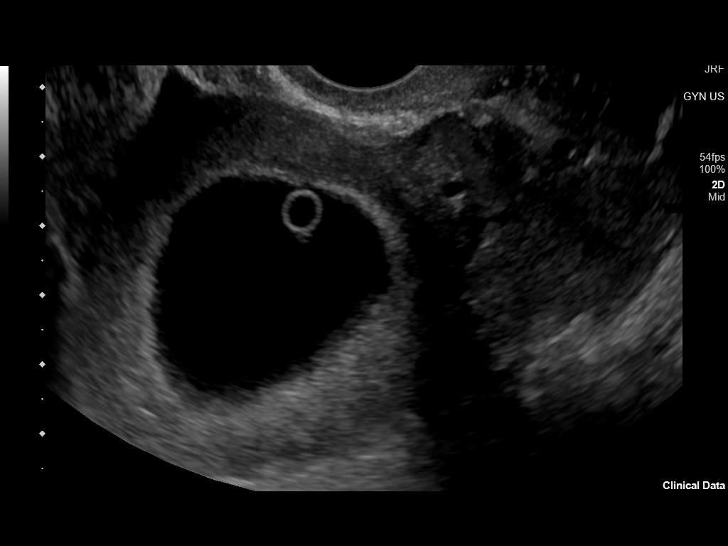
[im 69/134]
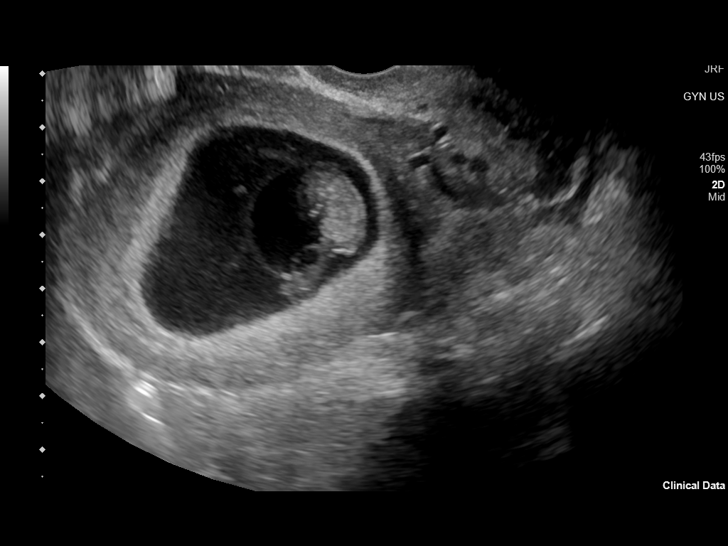
[im 79/134]
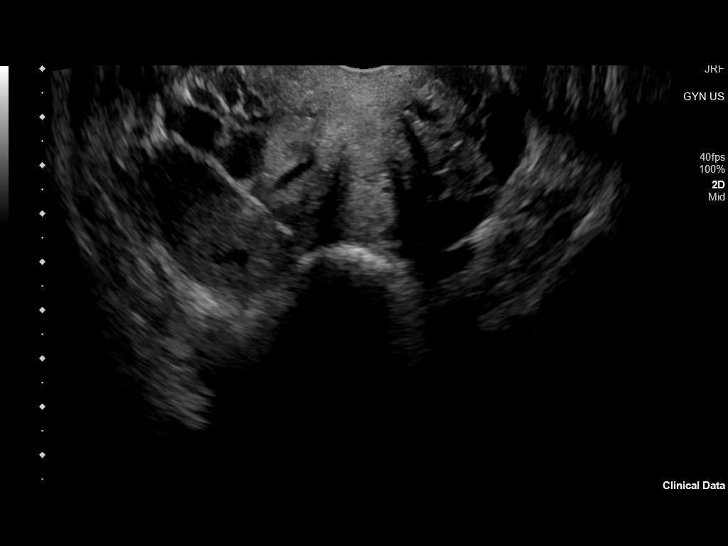
[im 89/134]
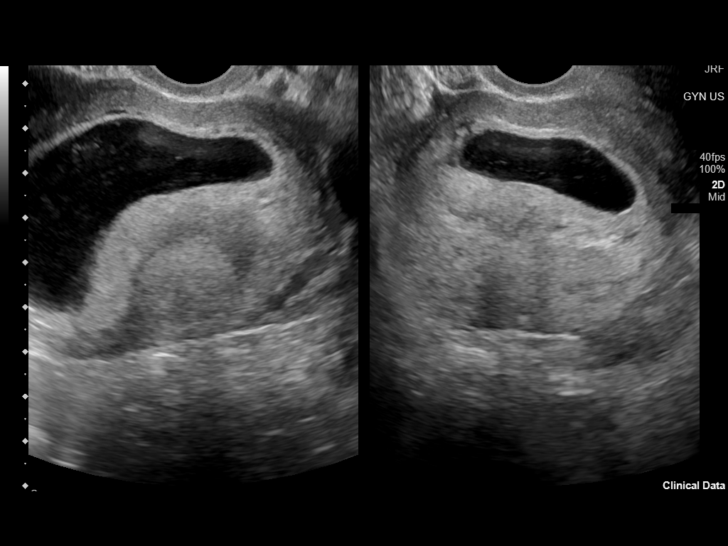
[im 99/134]
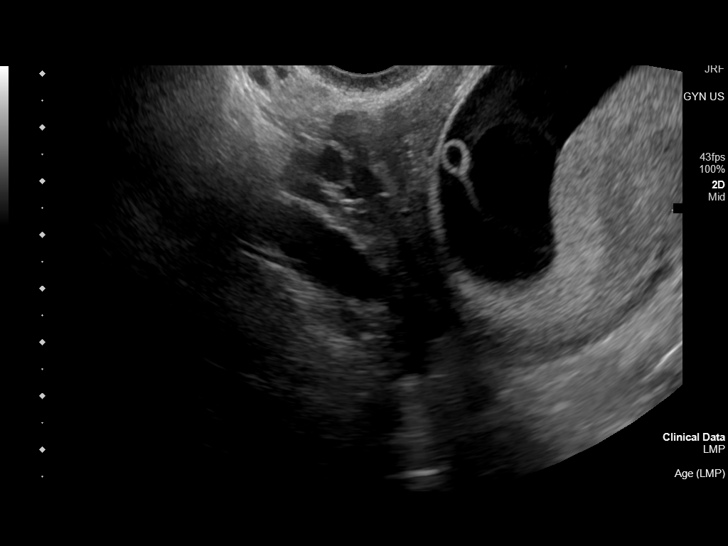
[im 109/134]
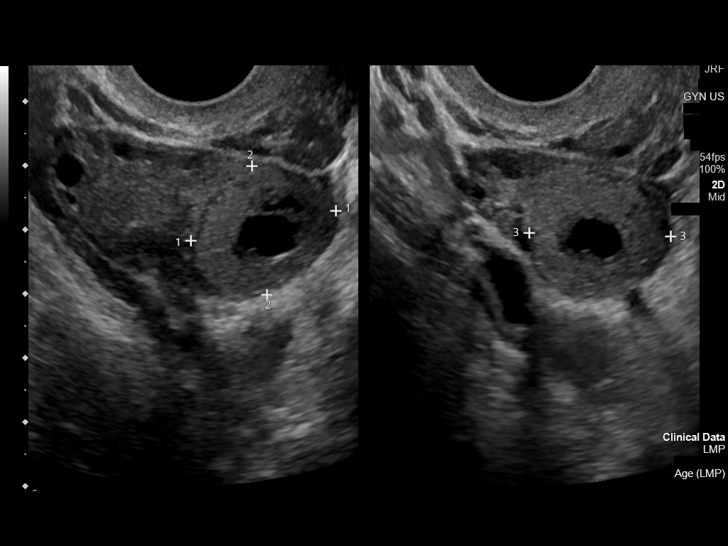
[im 119/134]
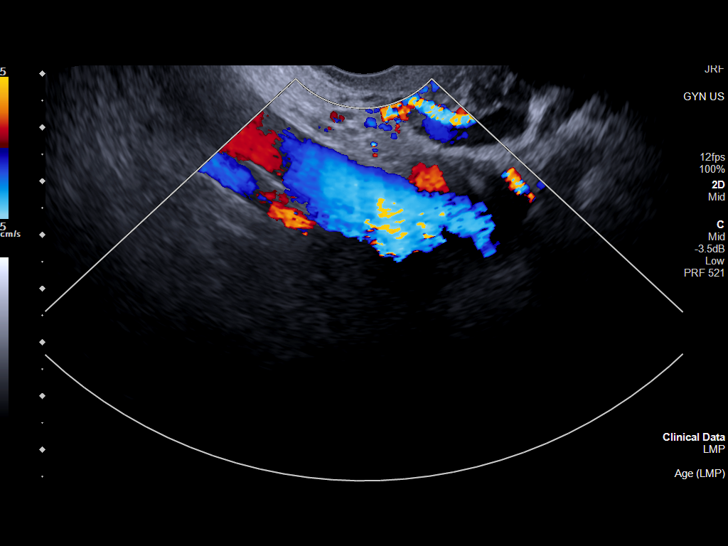
[im 129/134]
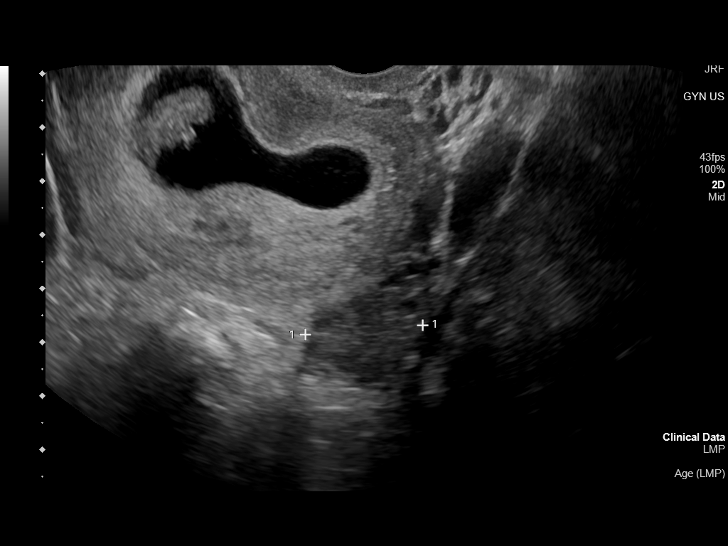

[13 of 28 positions shown; findings below may reference images not displayed]

FINDINGS: Intrauterine gestational sac: Single

Yolk sac:  Visualized.

Embryo:  Visualized.

Cardiac Activity: Visualized.

Heart Rate: 171 bpm

CRL:  21.8 mm   8 w   6 d                  US EDC: 03/25/2021

Subchorionic hemorrhage: Hypoattenuating region adjacent the
implantation site measuring 0.9 x 1.0 x 1.5 cm may reflect a small
subchorionic hemorrhage.

Maternal uterus/adnexae: Anteverted uterus. There is an ill-defined
rounded, echogenic focus with posterior in the posterior uterine
body measuring approximately 2.4 x 3.1 x 2.0 cm suggestive of a
uterine fibroid. Thick-walled, cystic focus in the right ovary
measuring 2.3 x 2 x 2.2 cm compatible with a corpus luteum
particularly given peripheral color flow. No concerning adnexal
lesion. There is however a small volume of fluid in the pelvis and
about the left adnexa.
IMPRESSION: Single intrauterine gestation at 8 weeks, 6 days by crown-rump
length sonographic estimation.

Hypoechoic region adjacent the implantation site may reflect a small
subchorionic hemorrhage.

Echogenic probable fibroid towards the posterior uterine body.

Right corpus luteum.

Small volume of fluid in the pelvis and left adnexa, nonspecific and
possibly physiologic.
